# Patient Record
Sex: Female | Born: 1965 | Hispanic: Yes | Marital: Married | Smoking: Never smoker
Health system: Southern US, Community
[De-identification: ages and names within clinical notes are randomized; demographics above are authoritative.]

## PROBLEM LIST (undated history)

## (undated) DIAGNOSIS — I959 Hypotension, unspecified: Secondary | ICD-10-CM

## (undated) DIAGNOSIS — U071 COVID-19: Secondary | ICD-10-CM

## (undated) DIAGNOSIS — J690 Pneumonitis due to inhalation of food and vomit: Secondary | ICD-10-CM

## (undated) DIAGNOSIS — E119 Type 2 diabetes mellitus without complications: Secondary | ICD-10-CM

## (undated) DIAGNOSIS — J029 Acute pharyngitis, unspecified: Secondary | ICD-10-CM

## (undated) HISTORY — DX: Hypotension, unspecified: I95.9

## (undated) HISTORY — DX: Pneumonitis due to inhalation of food and vomit: J69.0

## (undated) HISTORY — DX: Acute pharyngitis, unspecified: J02.9

---

## 2006-03-23 ENCOUNTER — Emergency Department: Payer: Self-pay | Admitting: Emergency Medicine

## 2006-04-04 ENCOUNTER — Ambulatory Visit: Payer: Self-pay | Admitting: Advanced Practice Midwife

## 2007-04-18 ENCOUNTER — Ambulatory Visit: Payer: Self-pay | Admitting: Family Medicine

## 2007-05-20 ENCOUNTER — Encounter: Payer: Self-pay | Admitting: Maternal & Fetal Medicine

## 2007-06-24 ENCOUNTER — Encounter: Payer: Self-pay | Admitting: Maternal & Fetal Medicine

## 2019-02-26 ENCOUNTER — Other Ambulatory Visit: Payer: Self-pay

## 2019-02-26 DIAGNOSIS — Z20822 Contact with and (suspected) exposure to covid-19: Secondary | ICD-10-CM

## 2019-02-27 LAB — NOVEL CORONAVIRUS, NAA: SARS-CoV-2, NAA: NOT DETECTED

## 2019-03-10 ENCOUNTER — Other Ambulatory Visit: Payer: Self-pay

## 2019-03-10 ENCOUNTER — Emergency Department: Payer: HRSA Program

## 2019-03-10 ENCOUNTER — Emergency Department
Admission: EM | Admit: 2019-03-10 | Discharge: 2019-03-10 | Disposition: A | Discharge status: Home / Self Care | Payer: HRSA Program | Attending: Emergency Medicine | Admitting: Emergency Medicine

## 2019-03-10 DIAGNOSIS — R05 Cough: Secondary | ICD-10-CM

## 2019-03-10 DIAGNOSIS — U071 COVID-19: Secondary | ICD-10-CM | POA: Diagnosis not present

## 2019-03-10 DIAGNOSIS — R059 Cough, unspecified: Secondary | ICD-10-CM

## 2019-03-10 DIAGNOSIS — R0602 Shortness of breath: Secondary | ICD-10-CM

## 2019-03-10 DIAGNOSIS — E119 Type 2 diabetes mellitus without complications: Secondary | ICD-10-CM | POA: Insufficient documentation

## 2019-03-10 LAB — COMPREHENSIVE METABOLIC PANEL
ALT: 78 U/L — ABNORMAL HIGH (ref 0–44)
AST: 77 U/L — ABNORMAL HIGH (ref 15–41)
Albumin: 3.8 g/dL (ref 3.5–5.0)
Alkaline Phosphatase: 199 U/L — ABNORMAL HIGH (ref 38–126)
Anion gap: 10 (ref 5–15)
BUN: 16 mg/dL (ref 6–20)
CO2: 23 mmol/L (ref 22–32)
Calcium: 9.6 mg/dL (ref 8.9–10.3)
Chloride: 101 mmol/L (ref 98–111)
Creatinine, Ser: 0.55 mg/dL (ref 0.44–1.00)
GFR calc Af Amer: >60 mL/min (ref >60)
GFR calc non Af Amer: >60 mL/min (ref >60)
Glucose, Bld: 345 mg/dL — ABNORMAL HIGH (ref 70–99)
Potassium: 3.6 mmol/L (ref 3.5–5.1)
Sodium: 134 mmol/L — ABNORMAL LOW (ref 135–145)
Total Bilirubin: 0.6 mg/dL (ref 0.3–1.2)
Total Protein: 8.7 g/dL — ABNORMAL HIGH (ref 6.5–8.1)

## 2019-03-10 LAB — SARS CORONAVIRUS 2 BY RT PCR (HOSPITAL ORDER, PERFORMED IN ~~LOC~~ HOSPITAL LAB): SARS Coronavirus 2: POSITIVE — AB

## 2019-03-10 LAB — TROPONIN I (HIGH SENSITIVITY): Troponin I (High Sensitivity): 5 ng/L (ref <18)

## 2019-03-10 LAB — CBC
HCT: 44.5 % (ref 36.0–46.0)
Hemoglobin: 16.1 g/dL — ABNORMAL HIGH (ref 12.0–15.0)
MCH: 31.3 pg (ref 26.0–34.0)
MCHC: 36.2 g/dL — ABNORMAL HIGH (ref 30.0–36.0)
MCV: 86.4 fL (ref 80.0–100.0)
Platelets: 144 10*3/uL — ABNORMAL LOW (ref 150–400)
RBC: 5.15 MIL/uL — ABNORMAL HIGH (ref 3.87–5.11)
RDW: 11.9 % (ref 11.5–15.5)
WBC: 5.6 10*3/uL (ref 4.0–10.5)
nRBC: 0 % (ref 0.0–0.2)

## 2019-03-10 LAB — BRAIN NATRIURETIC PEPTIDE: B Natriuretic Peptide: 53 pg/mL (ref 0.0–100.0)

## 2019-03-10 MED ORDER — ACETAMINOPHEN 500 MG PO TABS
1000.0000 mg | ORAL_TABLET | Freq: Once | ORAL | Status: AC
  Administered 2019-03-10: 1000 mg via ORAL
  Filled 2019-03-10: qty 2

## 2019-03-10 MED ORDER — IPRATROPIUM-ALBUTEROL 0.5-2.5 (3) MG/3ML IN SOLN
3.0000 mL | RESPIRATORY_TRACT | Status: DC | PRN
  Administered 2019-03-10: 16:00:00 3 mL via RESPIRATORY_TRACT
  Filled 2019-03-10: qty 3

## 2019-03-10 MED ORDER — LACTATED RINGERS IV BOLUS
1000.0000 mL | Freq: Once | INTRAVENOUS | Status: AC
  Administered 2019-03-10: 16:00:00 1000 mL via INTRAVENOUS

## 2019-03-10 MED ORDER — ALBUTEROL SULFATE HFA 108 (90 BASE) MCG/ACT IN AERS: 2.0000 | INHALATION_SPRAY | Freq: Four times a day (QID) | RESPIRATORY_TRACT | 0 refills | Status: DC | PRN

## 2019-03-10 NOTE — ED Triage Notes (Signed)
Pt arrives via [facility] from home for SOB since yesterday, dx with covid on 08/19 and symptoms have been worsening. RR 27 at this time, O2 97% on RA. A&Ox4

## 2019-03-10 NOTE — ED Notes (Signed)
Pt reports chest pain 7/10 and headache 9/10, Dr. Charna Archer informed, verbal given for tylenol 1g PO

## 2019-03-10 NOTE — ED Provider Notes (Signed)
[facility] Emergency Department Provider Note   ____________________________________________   First MD Initiated Contact with Patient 03/10/19 1511     (approximate)  I have reviewed the triage vital signs and the nursing notes.   HISTORY  Chief Complaint Shortness of Breath    HPI Vickie Smith is a 53 y.o. female with possible history of type 2 diabetes who presents to the ED complaining of cough and shortness of breath.  Patient reports that she has been feeling malaised for at least the past week, recently tested positive for COVID-19 6 days ago.  Since then, she has had worsening nonproductive cough as well as difficulty catching her breath and soreness in her chest.  She has not noticed any pain or swelling in her legs, has not had any recent surgeries or chemotherapy.  She reports intermittent fevers as high as 101 at home.  She is not sure how she might of gotten COVID-19.        History reviewed. No pertinent past medical history.  There are no active problems to display for this patient.   History reviewed. No pertinent surgical history.  Prior to Admission medications   Medication Sig Start Date End Date Taking? Authorizing Provider  albuterol (VENTOLIN HFA) 108 (90 Base) MCG/ACT inhaler Inhale 2 puffs into the lungs every 6 (six) hours as needed for wheezing or shortness of breath. 03/10/19   Blake Divine, MD    Allergies Patient has no allergy information on record.  History reviewed. No pertinent family history.  Social History Social History   Tobacco Use  . Smoking status: Not on file  Substance Use Topics  . Alcohol use: Not on file  . Drug use: Not on file    Review of Systems  Constitutional: Positive for fevers and chills. Eyes: No visual changes. ENT: No sore throat. Cardiovascular: Positive for chest pain. Respiratory: Positive for shortness of breath and cough. Gastrointestinal: No abdominal pain.  No  nausea, no vomiting.  No diarrhea.  No constipation. Genitourinary: Negative for dysuria. Musculoskeletal: Negative for back pain. Skin: Negative for rash. Neurological: Negative for headaches, focal weakness or numbness.  ____________________________________________   PHYSICAL EXAM:  VITAL SIGNS: ED Triage Vitals  Enc Vitals Group     BP 03/10/19 1418 123/81     Pulse Rate 03/10/19 1418 68     Resp 03/10/19 1418 (!) 32     Temp 03/10/19 1418 99.1 F (37.3 C)     Temp Source 03/10/19 1418 Oral     SpO2 03/10/19 1417 97 %     Weight 03/10/19 1419 110 lb (49.9 kg)     Height 03/10/19 1419 5' 2"  (1.575 m)     Head Circumference --      Peak Flow --      Pain Score --      Pain Loc --      Pain Edu? --      Excl. in [place]? --     Constitutional: Alert and oriented. Eyes: Conjunctivae are normal. Head: Atraumatic. Nose: No congestion/rhinnorhea. Mouth/Throat: Mucous membranes are moist. Neck: Normal ROM Cardiovascular: Normal rate, regular rhythm. Grossly normal heart sounds. Respiratory: Tachypneic with no respiratory distress.  No retractions. Lungs CTAB. Gastrointestinal: Soft and nontender. No distention. Genitourinary: deferred Musculoskeletal: No lower extremity tenderness nor edema. Neurologic:  Normal speech and language. No gross focal neurologic deficits are appreciated. Skin:  Skin is warm, dry and intact. No rash noted. Psychiatric: Mood and affect are  normal. Speech and behavior are normal.  ____________________________________________   LABS (all labs ordered are listed, but only abnormal results are displayed)  Labs Reviewed  SARS CORONAVIRUS 2 ([place] [facility]) - Abnormal; Notable for the following components:      Result Value   SARS Coronavirus 2 POSITIVE (*)    All other components within normal limits  CBC - Abnormal; Notable for the following components:   RBC 5.15 (*)    Hemoglobin 16.1 (*)    MCHC 36.2 (*)     Platelets 144 (*)    All other components within normal limits  COMPREHENSIVE METABOLIC PANEL - Abnormal; Notable for the following components:   Sodium 134 (*)    Glucose, Bld 345 (*)    Total Protein 8.7 (*)    AST 77 (*)    ALT 78 (*)    Alkaline Phosphatase 199 (*)    All other components within normal limits  BRAIN NATRIURETIC PEPTIDE  TROPONIN I (HIGH SENSITIVITY)   ____________________________________________  EKG  ED ECG REPORT I, Blake Divine, the attending physician, personally viewed and interpreted this ECG.   Date: 03/10/2019  EKG Time: 14:23  Rate: 67  Rhythm: normal sinus rhythm  Axis: RAD  Intervals:none  ST&T Change: Normal    PROCEDURES  Procedure(s) performed (including Critical Care):  Procedures   ____________________________________________   INITIAL IMPRESSION / ASSESSMENT AND PLAN / ED COURSE       53 year old female with history of type 2 diabetes presents to the ED with worsening cough and shortness of breath in the setting of recent COVID-19 positive.  She is tachypneic upon arrival but in no significant respiratory distress and maintaining O2 sats.  Chest x-ray negative for acute process.  EKG without acute ischemic changes and troponin negative.  Do not suspect PE as she is low risk by [place], shortness of breath and chest soreness likely secondary to COVID-19.  Patient was ambulated without any decrease in O2 saturation, tachypnea improved following DuoNeb.  Will prescribe albuterol for home use, counseled patient on strict return precautions, patient agrees with plan.      ____________________________________________   FINAL CLINICAL IMPRESSION(S) / ED DIAGNOSES  Final diagnoses:  COVID-19 virus infection  Shortness of breath  Cough     ED Discharge Orders         Ordered    albuterol (VENTOLIN HFA) 108 (90 Base) MCG/ACT inhaler  Every 6 hours PRN     03/10/19 1803           Note:  This document was prepared  using Dragon voice recognition software and may include unintentional dictation errors.   Blake Divine, MD 03/10/19 [phone]

## 2019-03-10 NOTE — ED Notes (Signed)
Patient's triage completed via [facility] # [id number]

## 2019-07-29 ENCOUNTER — Encounter: Payer: Self-pay | Admitting: Emergency Medicine

## 2019-07-29 ENCOUNTER — Other Ambulatory Visit: Payer: Self-pay

## 2019-07-29 ENCOUNTER — Emergency Department
Admission: EM | Admit: 2019-07-29 | Discharge: 2019-07-29 | Disposition: A | Discharge status: Home / Self Care | Payer: Self-pay | Attending: Student | Admitting: Student

## 2019-07-29 DIAGNOSIS — R519 Headache, unspecified: Secondary | ICD-10-CM | POA: Insufficient documentation

## 2019-07-29 DIAGNOSIS — E1165 Type 2 diabetes mellitus with hyperglycemia: Secondary | ICD-10-CM | POA: Insufficient documentation

## 2019-07-29 DIAGNOSIS — Z7984 Long term (current) use of oral hypoglycemic drugs: Secondary | ICD-10-CM | POA: Insufficient documentation

## 2019-07-29 DIAGNOSIS — R739 Hyperglycemia, unspecified: Secondary | ICD-10-CM

## 2019-07-29 HISTORY — DX: Type 2 diabetes mellitus without complications: E11.9

## 2019-07-29 LAB — GLUCOSE, CAPILLARY: Glucose-Capillary: 432 mg/dL — ABNORMAL HIGH (ref 70–99)

## 2019-07-29 LAB — CBC
HCT: 42.8 % (ref 36.0–46.0)
Hemoglobin: 15.3 g/dL — ABNORMAL HIGH (ref 12.0–15.0)
MCH: 31.8 pg (ref 26.0–34.0)
MCHC: 35.7 g/dL (ref 30.0–36.0)
MCV: 89 fL (ref 80.0–100.0)
Platelets: 123 10*3/uL — ABNORMAL LOW (ref 150–400)
RBC: 4.81 MIL/uL (ref 3.87–5.11)
RDW: 12.3 % (ref 11.5–15.5)
WBC: 5.7 10*3/uL (ref 4.0–10.5)
nRBC: 0 % (ref 0.0–0.2)

## 2019-07-29 LAB — BASIC METABOLIC PANEL
Anion gap: 11 (ref 5–15)
BUN: 15 mg/dL (ref 6–20)
CO2: 25 mmol/L (ref 22–32)
Calcium: 9.6 mg/dL (ref 8.9–10.3)
Chloride: 98 mmol/L (ref 98–111)
Creatinine, Ser: 0.49 mg/dL (ref 0.44–1.00)
GFR calc Af Amer: >60 mL/min (ref >60)
GFR calc non Af Amer: >60 mL/min (ref >60)
Glucose, Bld: 414 mg/dL — ABNORMAL HIGH (ref 70–99)
Potassium: 4.2 mmol/L (ref 3.5–5.1)
Sodium: 134 mmol/L — ABNORMAL LOW (ref 135–145)

## 2019-07-29 LAB — URINALYSIS, COMPLETE (UACMP) WITH MICROSCOPIC
Bacteria, UA: NONE SEEN
Bilirubin Urine: NEGATIVE
Glucose, UA: >=500 mg/dL — AB
Hgb urine dipstick: NEGATIVE
Ketones, ur: NEGATIVE mg/dL
Leukocytes,Ua: NEGATIVE
Nitrite: NEGATIVE
Protein, ur: NEGATIVE mg/dL
Specific Gravity, Urine: 1.033 — ABNORMAL HIGH (ref 1.005–1.030)
Squamous Epithelial / HPF: NONE SEEN (ref 0–5)
WBC, UA: NONE SEEN WBC/hpf (ref 0–5)
pH: 7 (ref 5.0–8.0)

## 2019-07-29 MED ORDER — BUTALBITAL-APAP-CAFFEINE 50-325-40 MG PO TABS
1.0000 | ORAL_TABLET | Freq: Once | ORAL | Status: AC
  Administered 2019-07-29: 1 via ORAL
  Filled 2019-07-29: qty 1

## 2019-07-29 MED ORDER — KETOROLAC TROMETHAMINE 30 MG/ML IJ SOLN
15.0000 mg | Freq: Once | INTRAMUSCULAR | Status: AC
  Administered 2019-07-29: 15 mg via INTRAVENOUS
  Filled 2019-07-29: qty 1

## 2019-07-29 MED ORDER — METOCLOPRAMIDE HCL 5 MG/ML IJ SOLN: 10.0000 mg | Freq: Once | INTRAMUSCULAR | Status: DC

## 2019-07-29 MED ORDER — METOCLOPRAMIDE HCL 5 MG/ML IJ SOLN
5.0000 mg | Freq: Once | INTRAMUSCULAR | Status: AC
  Administered 2019-07-29: 14:00:00 5 mg via INTRAVENOUS
  Filled 2019-07-29: qty 2

## 2019-07-29 MED ORDER — DIPHENHYDRAMINE HCL 50 MG/ML IJ SOLN
12.5000 mg | Freq: Once | INTRAMUSCULAR | Status: AC
  Administered 2019-07-29: 14:00:00 12.5 mg via INTRAVENOUS
  Filled 2019-07-29: qty 1

## 2019-07-29 MED ORDER — ACETAMINOPHEN 500 MG PO TABS
1000.0000 mg | ORAL_TABLET | Freq: Once | ORAL | Status: AC
  Administered 2019-07-29: 14:00:00 1000 mg via ORAL
  Filled 2019-07-29: qty 2

## 2019-07-29 MED ORDER — SODIUM CHLORIDE 0.9 % IV BOLUS
1000.0000 mL | Freq: Once | INTRAVENOUS | Status: AC
  Administered 2019-07-29: 1000 mL via INTRAVENOUS

## 2019-07-29 NOTE — ED Triage Notes (Addendum)
Pt here for headache to right side of head. + blurry vision to both eyes.  + nausea.  Denies vomiting/diarrhea. No fever. No history of headaches or ever having a headache like this.  Has trained OTC meds for pain with no relief.  Has not been taking her metformin because it makes her vomit.  No photophobia.

## 2019-07-29 NOTE — ED Notes (Signed)
Pt c/o worsening HA over the past 2 weeks on right side of neck/shoulder and head with blurred vision. Pt is suppose to be on metformin but states it causes N/V and does not take it. Pt [id number]. Pt has pain with pushing and pulling upper extremities on the right neck/shoulder and grimaces.  No noted neuro deficits. At this time. Pt is ambulatory from the [facility] [place] to exam room 50 with a steady gait.

## 2019-07-29 NOTE — Discharge Instructions (Addendum)
Thank you for letting us take care of you in the emergency department today.   Please continue to take any regular, prescribed medications. Please stay well hydrated and continue to eat a healthy, well balanced diet.   Please follow up with: - Your primary care doctor to review your ER visit and follow up on your symptoms.   Please return to the ER for any new or worsening symptoms.    Gracias por dejarnos atenderlo en el departamento de emergencias hoy.  Contine tomando cualquier medicamento recetado con regularidad. Mantngase bien hidratado y contine con Ardelia Mems dieta sana y equilibrada.  Contine con: - Su mdico de atencin primaria para revisar su visita a la sala de emergencias y hacer un seguimiento de sus sntomas.  Regrese a la sala de emergencias si tiene sntomas nuevos o que empeoran.

## 2019-07-29 NOTE — ED Notes (Signed)
[place] not working. Pt verbalized understands discharge instructions per interpreter.

## 2019-07-29 NOTE — ED Provider Notes (Signed)
[facility] Emergency Department Provider Note  ____________________________________________   First MD Initiated Contact with Patient 07/29/19 1338     (approximate)  I have reviewed the triage vital signs and the nursing notes.  History  Chief Complaint Headache    HPI Vickie Smith is a 54 y.o. female with history of diabetes who presents to the emergency department for headache.  Patient states she has had an ongoing headache for the last 2 weeks.  Severity has been increasing over time, currently 10/10 in severity.  Not thunderclap or worst at onset.  Headache is located on the right side of her head as well as the right lateral neck and shoulder area.  She describes it as a throbbing type sensation.  Associated with some nausea, but no vomiting.  No weakness, numbness, tingling, speech difficulties, or facial droop.  She reports some bilateral blurry vision, but this has been ongoing for quite some time and seems to be intermittent, worsens when she is not feeling well.  She has a history of diabetes, prescribed Metformin, but has not been taking it due to side effects including nausea and vomiting.   Past Medical Hx Past Medical History:  Diagnosis Date  . Diabetes mellitus without complication ([place])     Problem List There are no problems to display for this patient.   Past Surgical Hx History reviewed. No pertinent surgical history.  Medications Prior to Admission medications   Medication Sig Start Date End Date Taking? Authorizing Provider  albuterol (VENTOLIN HFA) 108 (90 Base) MCG/ACT inhaler Inhale 2 puffs into the lungs every 6 (six) hours as needed for wheezing or shortness of breath. 03/10/19   Blake Divine, MD    Allergies Patient has no known allergies.  Family Hx History reviewed. No pertinent family history.  Social Hx Social History   Tobacco Use  . Smoking status: Never Smoker  . Smokeless tobacco: Never Used    Substance Use Topics  . Alcohol use: Never  . Drug use: Never     Review of Systems  Constitutional: Negative for fever, chills. Eyes: + for visual changes. ENT: Negative for sore throat. Cardiovascular: Negative for chest pain. Respiratory: Negative for shortness of breath. Gastrointestinal: Negative for nausea, vomiting.  Genitourinary: Negative for dysuria. Musculoskeletal: Negative for leg swelling. Skin: Negative for rash. Neurological: +  for headaches.   Physical Exam  Vital Signs: ED Triage Vitals [07/29/19 1132]  Enc Vitals Group     BP 110/83     Pulse Rate 79     Resp 18     Temp 98.4 F (36.9 C)     Temp Source Oral     SpO2 97 %     Weight 119 lb (54 kg)     Height 4' 9"  (1.448 m)     Head Circumference      Peak Flow      Pain Score 10     Pain Loc      Pain Edu?      Excl. in [place]?     Constitutional: Alert and oriented.  Head: Normocephalic. Atraumatic.  No tenderness to palpation along the temporal artery.  No temporal artery beading appreciated on palpation. Eyes: Conjunctivae clear. Sclera anicteric. Nose: No congestion. No rhinorrhea. Mouth/Throat: Wearing mask.  Neck: No stridor.   Cardiovascular: Normal rate, regular rhythm. Extremities well perfused. Respiratory: Normal respiratory effort.  Lungs CTAB. Gastrointestinal: Soft. Non-tender. Non-distended.  Musculoskeletal: No lower extremity edema. No deformities. Neurologic:  Normal speech and language. No gross focal neurologic deficits are appreciated. Alert and oriented.  Face symmetric.  Tongue midline.  Cranial nerves II through XII intact. UE and LE strength 5/5 and symmetric. UE and LE SILT. Ambulatory w/ steady gait.  Skin: Skin is warm, dry and intact. No rash noted. Psychiatric: Mood and affect are appropriate for situation.    Procedures  Procedure(s) performed (including critical care):  Procedures   Initial Impression / Assessment and Plan / ED Course  54 y.o. female  who presents to the ED for headache, as above.  Ddx: tension headache, migraine headache, MSK.  No fevers, neck stiffness, meningismus, doubt meningitis.  No tenderness along the temporal artery, or temporal artery beading, doubt temporal arteritis.  Not thunderclap in description or worst at onset, doubt SAH. Suspect some component of her feeling generally unwell, as well as her blurry vision, may be related to her diabetes and uncontrolled hyperglycemia.  Emphasized the importance of managing this through her PCP. Advised following up with the PCP within at least 1 week to rediscuss her medication regimen and control of her blood sugar.  Basic labs reveal hyperglycemia, no anion gap or evidence of DKA.  Plan for symptomatic treatment and reassess.  Patient reports improvement in her headache from 10/10 originally down to 6/10.  Feel comfortable with discharge at this time.  Again emphasized the importance of outpatient PCP follow-up.  She voices understanding of this.  Given return precautions.   Final Clinical Impression(s) / ED Diagnosis  Final diagnoses:  Pounding headache  Hyperglycemia       Note:  This document was prepared using Dragon voice recognition software and may include unintentional dictation errors.   Lilia Pro., MD 07/29/19 [phone]

## 2019-08-12 ENCOUNTER — Encounter
Admission: EM | Disposition: A | Discharge status: Other Acute Inpatient Hospital | Payer: Self-pay | Source: Home / Self Care | Attending: Internal Medicine

## 2019-08-12 ENCOUNTER — Other Ambulatory Visit: Payer: Self-pay

## 2019-08-12 ENCOUNTER — Observation Stay: Payer: Self-pay | Admitting: Certified Registered Nurse Anesthetist

## 2019-08-12 ENCOUNTER — Inpatient Hospital Stay
Admission: EM | Admit: 2019-08-12 | Discharge: 2019-08-17 | DRG: 166 | Disposition: A | Discharge status: Other Acute Inpatient Hospital | Payer: Self-pay | Attending: Internal Medicine | Admitting: Internal Medicine

## 2019-08-12 ENCOUNTER — Emergency Department: Payer: Self-pay

## 2019-08-12 ENCOUNTER — Inpatient Hospital Stay: Payer: Self-pay

## 2019-08-12 DIAGNOSIS — D6959 Other secondary thrombocytopenia: Secondary | ICD-10-CM | POA: Diagnosis present

## 2019-08-12 DIAGNOSIS — J690 Pneumonitis due to inhalation of food and vomit: Secondary | ICD-10-CM

## 2019-08-12 DIAGNOSIS — R042 Hemoptysis: Secondary | ICD-10-CM

## 2019-08-12 DIAGNOSIS — K3189 Other diseases of stomach and duodenum: Secondary | ICD-10-CM | POA: Diagnosis present

## 2019-08-12 DIAGNOSIS — D696 Thrombocytopenia, unspecified: Secondary | ICD-10-CM

## 2019-08-12 DIAGNOSIS — J9690 Respiratory failure, unspecified, unspecified whether with hypoxia or hypercapnia: Secondary | ICD-10-CM | POA: Diagnosis present

## 2019-08-12 DIAGNOSIS — J9601 Acute respiratory failure with hypoxia: Secondary | ICD-10-CM | POA: Diagnosis present

## 2019-08-12 DIAGNOSIS — K7581 Nonalcoholic steatohepatitis (NASH): Secondary | ICD-10-CM | POA: Diagnosis present

## 2019-08-12 DIAGNOSIS — E1165 Type 2 diabetes mellitus with hyperglycemia: Secondary | ICD-10-CM

## 2019-08-12 DIAGNOSIS — K766 Portal hypertension: Secondary | ICD-10-CM | POA: Diagnosis present

## 2019-08-12 DIAGNOSIS — Z8616 Personal history of COVID-19: Secondary | ICD-10-CM

## 2019-08-12 DIAGNOSIS — K922 Gastrointestinal hemorrhage, unspecified: Secondary | ICD-10-CM

## 2019-08-12 DIAGNOSIS — J9691 Respiratory failure, unspecified with hypoxia: Secondary | ICD-10-CM

## 2019-08-12 DIAGNOSIS — K7460 Unspecified cirrhosis of liver: Secondary | ICD-10-CM

## 2019-08-12 DIAGNOSIS — R7989 Other specified abnormal findings of blood chemistry: Secondary | ICD-10-CM | POA: Diagnosis present

## 2019-08-12 DIAGNOSIS — R0489 Hemorrhage from other sites in respiratory passages: Principal | ICD-10-CM | POA: Diagnosis present

## 2019-08-12 DIAGNOSIS — J9602 Acute respiratory failure with hypercapnia: Secondary | ICD-10-CM | POA: Diagnosis present

## 2019-08-12 DIAGNOSIS — J029 Acute pharyngitis, unspecified: Secondary | ICD-10-CM

## 2019-08-12 DIAGNOSIS — R945 Abnormal results of liver function studies: Secondary | ICD-10-CM | POA: Diagnosis present

## 2019-08-12 DIAGNOSIS — R739 Hyperglycemia, unspecified: Secondary | ICD-10-CM

## 2019-08-12 DIAGNOSIS — E119 Type 2 diabetes mellitus without complications: Secondary | ICD-10-CM

## 2019-08-12 DIAGNOSIS — I959 Hypotension, unspecified: Secondary | ICD-10-CM

## 2019-08-12 DIAGNOSIS — K920 Hematemesis: Secondary | ICD-10-CM

## 2019-08-12 HISTORY — DX: Hematemesis: K92.0

## 2019-08-12 HISTORY — DX: Hemoptysis: R04.2

## 2019-08-12 HISTORY — DX: Gastrointestinal hemorrhage, unspecified: K92.2

## 2019-08-12 HISTORY — PX: ESOPHAGOGASTRODUODENOSCOPY: SHX5428

## 2019-08-12 LAB — CBC WITH DIFFERENTIAL/PLATELET
Abs Immature Granulocytes: 0.01 10*3/uL (ref 0.00–0.07)
Basophils Absolute: 0.1 10*3/uL (ref 0.0–0.1)
Basophils Relative: 2 %
Eosinophils Absolute: 0.2 10*3/uL (ref 0.0–0.5)
Eosinophils Relative: 4 %
HCT: 42.9 % (ref 36.0–46.0)
Hemoglobin: 15.3 g/dL — ABNORMAL HIGH (ref 12.0–15.0)
Immature Granulocytes: 0 %
Lymphocytes Relative: 41 %
Lymphs Abs: 1.9 10*3/uL (ref 0.7–4.0)
MCH: 31.4 pg (ref 26.0–34.0)
MCHC: 35.7 g/dL (ref 30.0–36.0)
MCV: 87.9 fL (ref 80.0–100.0)
Monocytes Absolute: 0.3 10*3/uL (ref 0.1–1.0)
Monocytes Relative: 6 %
Neutro Abs: 2.2 10*3/uL (ref 1.7–7.7)
Neutrophils Relative %: 47 %
Platelets: 119 10*3/uL — ABNORMAL LOW (ref 150–400)
RBC: 4.88 MIL/uL (ref 3.87–5.11)
RDW: 12.2 % (ref 11.5–15.5)
WBC: 4.7 10*3/uL (ref 4.0–10.5)
nRBC: 0 % (ref 0.0–0.2)

## 2019-08-12 LAB — GLUCOSE, CAPILLARY
Glucose-Capillary: 189 mg/dL — ABNORMAL HIGH (ref 70–99)
Glucose-Capillary: 155 mg/dL — ABNORMAL HIGH (ref 70–99)
Glucose-Capillary: 242 mg/dL — ABNORMAL HIGH (ref 70–99)

## 2019-08-12 LAB — URINE DRUG SCREEN, QUALITATIVE (ARMC ONLY)
Amphetamines, Ur Screen: NOT DETECTED
Barbiturates, Ur Screen: NOT DETECTED
Benzodiazepine, Ur Scrn: NOT DETECTED
Cannabinoid 50 Ng, Ur ~~LOC~~: NOT DETECTED
Cocaine Metabolite,Ur ~~LOC~~: NOT DETECTED
MDMA (Ecstasy)Ur Screen: NOT DETECTED
Methadone Scn, Ur: NOT DETECTED
Opiate, Ur Screen: NOT DETECTED
Phencyclidine (PCP) Ur S: NOT DETECTED
Tricyclic, Ur Screen: NOT DETECTED

## 2019-08-12 LAB — COMPREHENSIVE METABOLIC PANEL
ALT: 109 U/L — ABNORMAL HIGH (ref 0–44)
AST: 79 U/L — ABNORMAL HIGH (ref 15–41)
Albumin: 4.3 g/dL (ref 3.5–5.0)
Alkaline Phosphatase: 174 U/L — ABNORMAL HIGH (ref 38–126)
Anion gap: 12 (ref 5–15)
BUN: 16 mg/dL (ref 6–20)
CO2: 20 mmol/L — ABNORMAL LOW (ref 22–32)
Calcium: 9.5 mg/dL (ref 8.9–10.3)
Chloride: 103 mmol/L (ref 98–111)
Creatinine, Ser: 0.48 mg/dL (ref 0.44–1.00)
GFR calc Af Amer: >60 mL/min (ref >60)
GFR calc non Af Amer: >60 mL/min (ref >60)
Glucose, Bld: 317 mg/dL — ABNORMAL HIGH (ref 70–99)
Potassium: 3.9 mmol/L (ref 3.5–5.1)
Sodium: 135 mmol/L (ref 135–145)
Total Bilirubin: 0.7 mg/dL (ref 0.3–1.2)
Total Protein: 8.4 g/dL — ABNORMAL HIGH (ref 6.5–8.1)

## 2019-08-12 LAB — CBC
HCT: 43.1 % (ref 36.0–46.0)
HCT: 37.1 % (ref 36.0–46.0)
Hemoglobin: 15.4 g/dL — ABNORMAL HIGH (ref 12.0–15.0)
Hemoglobin: 13 g/dL (ref 12.0–15.0)
MCH: 31.2 pg (ref 26.0–34.0)
MCH: 31.6 pg (ref 26.0–34.0)
MCHC: 35.7 g/dL (ref 30.0–36.0)
MCHC: 35 g/dL (ref 30.0–36.0)
MCV: 87.2 fL (ref 80.0–100.0)
MCV: 90 fL (ref 80.0–100.0)
Platelets: 130 10*3/uL — ABNORMAL LOW (ref 150–400)
Platelets: 111 10*3/uL — ABNORMAL LOW (ref 150–400)
RBC: 4.94 MIL/uL (ref 3.87–5.11)
RBC: 4.12 MIL/uL (ref 3.87–5.11)
RDW: 12.3 % (ref 11.5–15.5)
RDW: 12.6 % (ref 11.5–15.5)
WBC: 5.7 10*3/uL (ref 4.0–10.5)
WBC: 5.8 10*3/uL (ref 4.0–10.5)
nRBC: 0 % (ref 0.0–0.2)
nRBC: 0 % (ref 0.0–0.2)

## 2019-08-12 LAB — URINALYSIS, COMPLETE (UACMP) WITH MICROSCOPIC
Bacteria, UA: NONE SEEN
Bilirubin Urine: NEGATIVE
Glucose, UA: >=500 mg/dL — AB
Hgb urine dipstick: NEGATIVE
Ketones, ur: NEGATIVE mg/dL
Leukocytes,Ua: NEGATIVE
Nitrite: NEGATIVE
Protein, ur: NEGATIVE mg/dL
Specific Gravity, Urine: 1.02 (ref 1.005–1.030)
pH: 5 (ref 5.0–8.0)

## 2019-08-12 LAB — HEMOGLOBIN A1C
Hgb A1c MFr Bld: 11.5 % — ABNORMAL HIGH (ref 4.8–5.6)
Mean Plasma Glucose: 283.35 mg/dL

## 2019-08-12 LAB — LIPASE, BLOOD: Lipase: 56 U/L — ABNORMAL HIGH (ref 11–51)

## 2019-08-12 LAB — PROTIME-INR
INR: 1 (ref 0.8–1.2)
Prothrombin Time: 13.3 seconds (ref 11.4–15.2)

## 2019-08-12 LAB — HEPATITIS PANEL, ACUTE
HCV Ab: NONREACTIVE
Hep A IgM: NONREACTIVE
Hep B C IgM: NONREACTIVE
Hepatitis B Surface Ag: NONREACTIVE

## 2019-08-12 LAB — ETHANOL: Alcohol, Ethyl (B): <10 mg/dL (ref <10)

## 2019-08-12 LAB — HIV ANTIBODY (ROUTINE TESTING W REFLEX): HIV Screen 4th Generation wRfx: NONREACTIVE

## 2019-08-12 LAB — RESPIRATORY PANEL BY RT PCR (FLU A&B, COVID)
Influenza A by PCR: NEGATIVE
Influenza B by PCR: NEGATIVE
SARS Coronavirus 2 by RT PCR: NEGATIVE

## 2019-08-12 LAB — APTT: aPTT: 33 seconds (ref 24–36)

## 2019-08-12 SURGERY — EGD (ESOPHAGOGASTRODUODENOSCOPY)
Anesthesia: General

## 2019-08-12 MED ORDER — ROCURONIUM BROMIDE 100 MG/10ML IV SOLN
INTRAVENOUS | Status: DC | PRN
  Administered 2019-08-12: 30 mg via INTRAVENOUS

## 2019-08-12 MED ORDER — IOHEXOL 350 MG/ML SOLN
60.0000 mL | Freq: Once | INTRAVENOUS | Status: AC | PRN
  Administered 2019-08-12: 60 mL via INTRAVENOUS

## 2019-08-12 MED ORDER — VECURONIUM BROMIDE 10 MG IV SOLR: 10.0000 mg | INTRAVENOUS | Status: DC | PRN

## 2019-08-12 MED ORDER — PROPOFOL 1000 MG/100ML IV EMUL
0.0000 ug/kg/min | INTRAVENOUS | Status: DC
  Administered 2019-08-12: 50 ug/kg/min via INTRAVENOUS
  Administered 2019-08-12: 75 ug/kg/min via INTRAVENOUS
  Administered 2019-08-13: 20 ug/kg/min via INTRAVENOUS
  Filled 2019-08-12: qty 100

## 2019-08-12 MED ORDER — SODIUM CHLORIDE 0.9 % IV SOLN
8.0000 mg/h | INTRAVENOUS | Status: DC
  Filled 2019-08-12: qty 80

## 2019-08-12 MED ORDER — METHYLPREDNISOLONE SODIUM SUCC 40 MG IJ SOLR
40.0000 mg | Freq: Two times a day (BID) | INTRAMUSCULAR | Status: DC
  Administered 2019-08-13 – 2019-08-15 (×5): 40 mg via INTRAVENOUS
  Filled 2019-08-12 (×5): qty 1

## 2019-08-12 MED ORDER — FENTANYL CITRATE (PF) 100 MCG/2ML IJ SOLN
INTRAMUSCULAR | Status: AC
  Filled 2019-08-12: qty 2

## 2019-08-12 MED ORDER — ONDANSETRON HCL 4 MG/2ML IJ SOLN
INTRAMUSCULAR | Status: AC
  Filled 2019-08-12: qty 2

## 2019-08-12 MED ORDER — SODIUM CHLORIDE 0.9 % IV SOLN
500.0000 mg | INTRAVENOUS | Status: DC
  Administered 2019-08-12 – 2019-08-16 (×4): 500 mg via INTRAVENOUS
  Filled 2019-08-12 (×5): qty 500

## 2019-08-12 MED ORDER — METHYLPREDNISOLONE SODIUM SUCC 125 MG IJ SOLR
125.0000 mg | Freq: Once | INTRAMUSCULAR | Status: AC
  Administered 2019-08-12: 20:00:00 125 mg via INTRAVENOUS
  Filled 2019-08-12: qty 2

## 2019-08-12 MED ORDER — SODIUM CHLORIDE 0.9 % IV SOLN
0.0000 ug/min | INTRAVENOUS | Status: DC
  Filled 2019-08-12: qty 1

## 2019-08-12 MED ORDER — SODIUM CHLORIDE 0.9 % IV SOLN: 250.0000 mL | INTRAVENOUS | Status: DC | PRN

## 2019-08-12 MED ORDER — PROPOFOL 10 MG/ML IV BOLUS
INTRAVENOUS | Status: DC | PRN
  Administered 2019-08-12: 80 mg via INTRAVENOUS
  Administered 2019-08-12: 100 mg via INTRAVENOUS
  Administered 2019-08-12: 20 mg via INTRAVENOUS

## 2019-08-12 MED ORDER — LIDOCAINE HCL (CARDIAC) PF 100 MG/5ML IV SOSY
PREFILLED_SYRINGE | INTRAVENOUS | Status: DC | PRN
  Administered 2019-08-12 (×2): 50 mg via INTRATRACHEAL

## 2019-08-12 MED ORDER — INSULIN ASPART 100 UNIT/ML ~~LOC~~ SOLN
0.0000 [IU] | Freq: Every day | SUBCUTANEOUS | Status: DC
  Administered 2019-08-12: 22:00:00 2 [IU] via SUBCUTANEOUS
  Filled 2019-08-12: qty 1

## 2019-08-12 MED ORDER — FENTANYL 2500MCG IN NS 250ML (10MCG/ML) PREMIX INFUSION
0.0000 ug/h | INTRAVENOUS | Status: DC
  Administered 2019-08-13: 125 ug/h via INTRAVENOUS
  Filled 2019-08-12: qty 250

## 2019-08-12 MED ORDER — LIDOCAINE VISCOUS HCL 2 % MT SOLN
15.0000 mL | Freq: Once | OROMUCOSAL | Status: AC
  Administered 2019-08-12: 11:00:00 15 mL via ORAL
  Filled 2019-08-12: qty 15

## 2019-08-12 MED ORDER — MIDAZOLAM HCL 2 MG/2ML IJ SOLN: 2.0000 mg | INTRAMUSCULAR | Status: DC | PRN

## 2019-08-12 MED ORDER — PROPOFOL 10 MG/ML IV BOLUS
INTRAVENOUS | Status: AC
  Filled 2019-08-12: qty 20

## 2019-08-12 MED ORDER — SODIUM CHLORIDE 0.9 % IV SOLN
2.0000 g | Freq: Two times a day (BID) | INTRAVENOUS | Status: DC
  Administered 2019-08-12: 23:00:00 2 g via INTRAVENOUS
  Filled 2019-08-12: qty 2
  Filled 2019-08-12: qty 20

## 2019-08-12 MED ORDER — SODIUM CHLORIDE 0.9% FLUSH: 3.0000 mL | INTRAVENOUS | Status: DC | PRN

## 2019-08-12 MED ORDER — ONDANSETRON HCL 4 MG/2ML IJ SOLN
4.0000 mg | Freq: Four times a day (QID) | INTRAMUSCULAR | Status: DC | PRN
  Administered 2019-08-14 – 2019-08-16 (×4): 4 mg via INTRAVENOUS
  Filled 2019-08-12 (×5): qty 2

## 2019-08-12 MED ORDER — ONDANSETRON HCL 4 MG/2ML IJ SOLN
4.0000 mg | Freq: Once | INTRAMUSCULAR | Status: AC
  Administered 2019-08-12: 08:00:00 4 mg via INTRAVENOUS
  Filled 2019-08-12: qty 2

## 2019-08-12 MED ORDER — PANTOPRAZOLE SODIUM 40 MG IV SOLR: 40.0000 mg | Freq: Two times a day (BID) | INTRAVENOUS | Status: DC

## 2019-08-12 MED ORDER — ACETAMINOPHEN 325 MG PO TABS: 650.0000 mg | ORAL_TABLET | ORAL | Status: DC | PRN

## 2019-08-12 MED ORDER — SENNOSIDES 8.8 MG/5ML PO SYRP
5.0000 mL | ORAL_SOLUTION | Freq: Two times a day (BID) | ORAL | Status: DC | PRN
  Filled 2019-08-12: qty 5

## 2019-08-12 MED ORDER — INSULIN ASPART 100 UNIT/ML ~~LOC~~ SOLN
0.0000 [IU] | Freq: Three times a day (TID) | SUBCUTANEOUS | Status: DC
  Administered 2019-08-13 (×2): 3 [IU] via SUBCUTANEOUS
  Filled 2019-08-12 (×2): qty 1

## 2019-08-12 MED ORDER — ONDANSETRON HCL 4 MG/2ML IJ SOLN: 4.0000 mg | Freq: Three times a day (TID) | INTRAMUSCULAR | Status: DC | PRN

## 2019-08-12 MED ORDER — FAMOTIDINE IN NACL 20-0.9 MG/50ML-% IV SOLN
20.0000 mg | Freq: Two times a day (BID) | INTRAVENOUS | Status: DC
  Administered 2019-08-12 – 2019-08-17 (×10): 20 mg via INTRAVENOUS
  Filled 2019-08-12 (×10): qty 50

## 2019-08-12 MED ORDER — ALUM & MAG HYDROXIDE-SIMETH 200-200-20 MG/5ML PO SUSP
30.0000 mL | Freq: Once | ORAL | Status: AC
  Administered 2019-08-12: 11:00:00 30 mL via ORAL
  Filled 2019-08-12: qty 30

## 2019-08-12 MED ORDER — MIDAZOLAM HCL 2 MG/2ML IJ SOLN
2.0000 mg | INTRAMUSCULAR | Status: DC | PRN
  Administered 2019-08-12 (×2): 2 mg via INTRAVENOUS
  Filled 2019-08-12 (×2): qty 2

## 2019-08-12 MED ORDER — SODIUM CHLORIDE 0.9% FLUSH
3.0000 mL | Freq: Two times a day (BID) | INTRAVENOUS | Status: DC
  Administered 2019-08-12 – 2019-08-15 (×5): 3 mL via INTRAVENOUS

## 2019-08-12 MED ORDER — PHENYLEPHRINE HCL-NACL 10-0.9 MG/250ML-% IV SOLN
0.0000 ug/min | INTRAVENOUS | Status: DC
  Filled 2019-08-12: qty 250

## 2019-08-12 MED ORDER — VECURONIUM BROMIDE 10 MG IV SOLR
INTRAVENOUS | Status: AC
  Administered 2019-08-12: 19:00:00 10 mg via INTRAVENOUS
  Filled 2019-08-12: qty 10

## 2019-08-12 MED ORDER — CHLORHEXIDINE GLUCONATE CLOTH 2 % EX PADS
6.0000 | MEDICATED_PAD | Freq: Every day | CUTANEOUS | Status: DC
  Administered 2019-08-12 – 2019-08-15 (×3): 6 via TOPICAL

## 2019-08-12 MED ORDER — MORPHINE SULFATE (PF) 2 MG/ML IV SOLN: 2.0000 mg | INTRAVENOUS | Status: DC | PRN

## 2019-08-12 MED ORDER — SODIUM CHLORIDE 0.9 % IV SOLN
50.0000 ug/h | INTRAVENOUS | Status: DC
  Filled 2019-08-12 (×3): qty 1

## 2019-08-12 MED ORDER — FENTANYL CITRATE (PF) 100 MCG/2ML IJ SOLN
INTRAMUSCULAR | Status: DC | PRN
  Administered 2019-08-12: 50 ug via INTRAVENOUS
  Administered 2019-08-12: 25 ug via INTRAVENOUS
  Administered 2019-08-12: 50 ug via INTRAVENOUS

## 2019-08-12 MED ORDER — LIDOCAINE HCL (PF) 2 % IJ SOLN
INTRAMUSCULAR | Status: AC
  Filled 2019-08-12: qty 10

## 2019-08-12 MED ORDER — LORAZEPAM 2 MG/ML IJ SOLN: 2.0000 mg | Freq: Once | INTRAMUSCULAR | Status: AC

## 2019-08-12 MED ORDER — SUCCINYLCHOLINE CHLORIDE 20 MG/ML IJ SOLN
INTRAMUSCULAR | Status: DC | PRN
  Administered 2019-08-12: 60 mg via INTRAVENOUS
  Administered 2019-08-12: 80 mg via INTRAVENOUS

## 2019-08-12 MED ORDER — SODIUM CHLORIDE 0.9 % IV SOLN: 1.0000 g | Freq: Once | INTRAVENOUS | Status: DC

## 2019-08-12 MED ORDER — PANTOPRAZOLE SODIUM 40 MG IV SOLR
40.0000 mg | Freq: Once | INTRAVENOUS | Status: AC
  Administered 2019-08-12: 08:00:00 40 mg via INTRAVENOUS
  Filled 2019-08-12: qty 40

## 2019-08-12 MED ORDER — LORAZEPAM 2 MG/ML IJ SOLN
INTRAMUSCULAR | Status: AC
  Administered 2019-08-12: 19:00:00 2 mg via INTRAVENOUS
  Filled 2019-08-12: qty 1

## 2019-08-12 MED ORDER — FENTANYL 2500MCG IN NS 250ML (10MCG/ML) PREMIX INFUSION
INTRAVENOUS | Status: AC
  Administered 2019-08-12: 19:00:00 2500 ug via INTRAVENOUS
  Filled 2019-08-12: qty 250

## 2019-08-12 MED ORDER — SODIUM CHLORIDE 0.9 % IV SOLN
INTRAVENOUS | Status: DC
  Administered 2019-08-12: 15:00:00 1000 mL via INTRAVENOUS

## 2019-08-12 MED ORDER — ONDANSETRON HCL 4 MG/2ML IJ SOLN
INTRAMUSCULAR | Status: DC | PRN
  Administered 2019-08-12: 4 mg via INTRAVENOUS

## 2019-08-12 NOTE — Progress Notes (Signed)
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  There is absolutely no blood in the stomach or esophagus.  No varices.  After patient is extubated in the endoscopy room, she started coughing up fresh blood, anesthesiologist bedside.  There was no blood in the oropharynx during withdrawal of the scope.  Recommend urgent pulmonary evaluation to evaluate for pulmonary hemorrhage Dr Blaine Hamper is made aware  Cephas Darby, MD [address]  [place]  [place], [place] [address]  Main: [phone]  Fax: [phone] Pager: [phone]

## 2019-08-12 NOTE — Care Management (Signed)
This is a no charge note  54 year old lady with past medical history of hypertension, who was initially admitted due to patient reported hematemesis.  GI was consulted, Dr. Marius Ditch did urgent EGD, which was negative. Patient was found to have severe hemoptysis at the procedure, suspecting pulmonary hemorrhage.  Patient was intubated in order to protect her airway.  Dr. Mortimer Fries of ICU was consulted, who took over the care and pt is admitted to ICU.  Ivor Costa, MD  [facility]   If 7PM-7AM, please contact night-coverage [email or web address] Password [facility] 08/12/2019, 7:28 PM

## 2019-08-12 NOTE — Consult Note (Signed)
Cephas Darby, MD [address]  [place]  [place],  [address]  Main: [phone]  Fax: [phone] Pager: [phone]   Consultation  Referring Provider:     No ref. provider found Primary Care Physician:  [facility] Primary Gastroenterologist: Althia Forts        Reason for Consultation:     Hematemesis  Date of Admission:  08/12/2019 Date of Consultation:  08/12/2019         HPI:   Vickie Smith is a 54 y.o. female Spanish-speaking, history of poorly controlled diabetes, not on insulin, hemoglobin A1c 11.2 who is admitted with 3 episodes of hematemesis, epigastric pain that started last night.  She also reported 1 episode of melena today.  She denies alcohol use, tobacco use.  Her hemoglobin is 15.3, platelets 119.  She does have chronic thrombocytopenia of unknown etiology since 02/2019.  Her LFTs returned abnormal alkaline phosphatase 174, AST 79, ALT 109, total bilirubin 0.7.  Were elevated in 02/2019.  No evidence of DKA, elevated blood sugars 317, normal BUN/creatinine.  Normal PT/INR, mildly elevated lipase, patient is started on pantoprazole drip.  Given her elevated LFTs, I have told the ER physician to order ultrasound liver with Dopplers which came back as cirrhosis of liver and patent portal vein.  Started her on octreotide drip as well as received Rocephin for SBP prophylaxis. Patient is n.p.o. Serum alcohol levels undetectable. Urine drug screen negative, COVID-19 test negative, She has not seen her PCP in 2 years.  She moved to [place] in [address], has 50 year old daughter, she works as a Oncologist No other family She denies IV drug abuse, tattoos, blood transfusions, multiple sexual partners, no known hepatitis B or C  NSAIDs: None  Antiplts/Anticoagulants/Anti thrombotics: None  GI Procedures: None She denies family history of known GI cancer  Past Medical History:  Diagnosis Date  . Diabetes mellitus without complication  ([place])     History reviewed. No pertinent surgical history.  Prior to Admission medications   Medication Sig Start Date End Date Taking? Authorizing Provider  acetaminophen (TYLENOL) 500 MG tablet Take 500-1,000 mg by mouth every 6 (six) hours as needed for mild pain or fever.   Yes [provider]   Current Facility-Administered Medications:  .  0.9 %  sodium chloride infusion, , Intravenous, Continuous, Ivor Costa, MD, Last Rate: 100 mL/hr at 08/12/19 1511, 1,000 mL at 08/12/19 1511 .  [MAR Hold] cefTRIAXone (ROCEPHIN) 1 g in sodium chloride 0.9 % 100 mL IVPB, 1 g, Intravenous, Once, Earleen Newport, MD .  Doug Sou Hold] insulin aspart (novoLOG) injection 0-5 Units, 0-5 Units, Subcutaneous, QHS, Ivor Costa, MD .  Doug Sou Hold] insulin aspart (novoLOG) injection 0-9 Units, 0-9 Units, Subcutaneous, TID WC, Ivor Costa, MD .  Doug Sou Hold] morphine 2 MG/ML injection 2 mg, 2 mg, Intravenous, Q4H PRN, Ivor Costa, MD .  octreotide (SANDOSTATIN) 500 mcg in sodium chloride 0.9 % 250 mL (2 mcg/mL) infusion, 50 mcg/hr, Intravenous, Continuous, Earleen Newport, MD .  Doug Sou Hold] ondansetron [facility]) injection 4 mg, 4 mg, Intravenous, Q8H PRN, Ivor Costa, MD .  pantoprazole (PROTONIX) 80 mg in sodium chloride 0.9 % 250 mL (0.32 mg/mL) infusion, 8 mg/hr, Intravenous, Continuous, Earleen Newport, MD .  Doug Sou Hold] pantoprazole (PROTONIX) injection 40 mg, 40 mg, Intravenous, Q12H, Earleen Newport, MD   History reviewed. No pertinent family history.   Social History   Tobacco Use  . Smoking status: Never  Smoker  . Smokeless tobacco: Never Used  Substance Use Topics  . Alcohol use: Never  . Drug use: Never    Allergies as of 08/12/2019  . (No Known Allergies)    Review of Systems:    All systems reviewed and negative except where noted in HPI.   Physical Exam:  Vital signs in last 24 hours: Temp:  [98.4 F (36.9 C)] 98.4 F (36.9 C) (02/02 1512) Pulse Rate:  [64-92] 67  (02/02 1512) Resp:  [12-22] 17 (02/02 1420) BP: (104-127)/(73-85) 107/74 (02/02 1420) SpO2:  [91 %-100 %] 98 % (02/02 1512) Weight:  [54 kg] 54 kg (02/02 0806)   General:   Pleasant, cooperative in NAD Head:  Normocephalic and atraumatic. Eyes:   No icterus.   Conjunctiva pink. PERRLA. Ears:  Normal auditory acuity. Neck:  Supple; no masses or thyroidomegaly Lungs: Respirations even and unlabored. Lungs clear to auscultation bilaterally.   No wheezes, crackles, or rhonchi.  Heart:  Regular rate and rhythm;  Without murmur, clicks, rubs or gallops Abdomen:  Soft, nondistended, nontender. Normal bowel sounds. No appreciable masses or hepatomegaly.  No rebound or guarding.  Rectal:  Not performed. Msk:  Symmetrical without gross deformities.  Strength normal Extremities:  Without edema, cyanosis or clubbing. Neurologic:  Alert and oriented x3;  grossly normal neurologically. Skin:  Intact without significant lesions or rashes. Psych:  Alert and cooperative. Normal affect.  LAB RESULTS: CBC Latest Ref Rng & Units 08/12/2019 08/12/2019 07/29/2019  WBC 4.0 - 10.5 K/uL 5.7 4.7 5.7  Hemoglobin 12.0 - 15.0 g/dL 15.4(H) 15.3(H) 15.3(H)  Hematocrit 36.0 - 46.0 % 43.1 42.9 42.8  Platelets 150 - 400 K/uL 130(L) 119(L) 123(L)    BMET BMP Latest Ref Rng & Units 08/12/2019 07/29/2019 03/10/2019  Glucose 70 - 99 mg/dL 317(H) 414(H) 345(H)  BUN 6 - 20 mg/dL _0 Creatinine 0.44 - 1.00 mg/dL 0.48 0.49 0.55  Sodium 135 - 145 mmol/L 135 134(L) 134(L)  Potassium 3.5 - 5.1 mmol/L 3.9 4.2 3.6  Chloride 98 - 111 mmol/L 103 98 101  CO2 22 - 32 mmol/L 20(L) 25 23  Calcium 8.9 - 10.3 mg/dL 9.5 9.6 9.6    LFT Hepatic Function Latest Ref Rng & Units 08/12/2019 03/10/2019  Total Protein 6.5 - 8.1 g/dL 8.4(H) 8.7(H)  Albumin 3.5 - 5.0 g/dL 4.3 3.8  AST 15 - 41 U/L 79(H) 77(H)  ALT 0 - 44 U/L 109(H) 78(H)  Alk Phosphatase 38 - 126 U/L 174(H) 199(H)  Total Bilirubin 0.3 - 1.2 mg/dL 0.7 0.6      STUDIES: US LIVER DOPPLER  Result Date: 08/12/2019 CLINICAL DATA:  Elevated LFTs EXAM: DUPLEX ULTRASOUND OF LIVER TECHNIQUE: Color and duplex Doppler ultrasound was performed to evaluate the hepatic in-flow and out-flow vessels. COMPARISON:  None. FINDINGS: Main Portal Vein size: 1 cm Portal Vein Velocities (all hepatopetal): Main Prox:  27 cm/sec Main Mid: 25 cm/sec Main Dist:  22 cm/sec Right: 15 cm/sec Left: 15 cm/sec Hepatic Vein Velocities (all hepatofugal): Right:  23 cm/sec Middle:  18 cm/sec Left:  25 cm/sec IVC: Present and patent with normal respiratory phasicity. Velocity 76 cm/sec. Hepatic Artery Velocity:  55 cm/sec Splenic Vein Velocity:  14 cm/sec Spleen: 8.7 cm x 10.9 cm x 4.2 cm with a total volume of 209 cm^3 (411 cm^3 is upper limit normal) Portal Vein Occlusion/Thrombus: No Splenic Vein Occlusion/Thrombus: No Ascites: None Varices: None IMPRESSION: 1. Unremarkable hepatic vascular Doppler evaluation. Electronically Signed   By: Keturah Barre  Vernard Gambles M.D.   On: 08/12/2019 13:19   US ABDOMEN LIMITED RUQ  Result Date: 08/12/2019 CLINICAL DATA:  Elevated liver enzymes vomiting for 1 day EXAM: ULTRASOUND ABDOMEN LIMITED RIGHT UPPER QUADRANT COMPARISON:  None. FINDINGS: Gallbladder: No gallstones or wall thickening visualized. No sonographic Murphy sign noted by sonographer. Common bile duct: Diameter: 4 mm Liver: Moderately heterogeneous hepatic echotexture, suggestion of physeal widening and nodular contour. Area of variable hepatic echogenicity measuring approximately 3.5 x 3.6 cm in the anterior right hepatic lobe (image 33 of 35. Also suggested on other images along the medial aspect of the right hemi liver, perhaps in subsegment V. on some images geographic increased echogenicity is displayed. Portal vein is patent on color Doppler imaging with normal direction of blood flow towards the liver. Other: No ascites. IMPRESSION: 1. Signs of cirrhosis with possible focal hepatic lesion versus is area  of fatty infiltration. Multiphase imaging with CT or MR is suggested on follow-up. 2. No signs of cholecystitis or biliary calculi. Electronically Signed   By: Zetta Bills M.D.   On: 08/12/2019 12:19      Impression / Plan:   Vickie Smith is a 54 y.o. Hispanic female with history of uncontrolled diabetes, overweight, chronic thrombocytopenia and elevated LFTs is seen in consultation for hematemesis, epigastric pain.  New diagnosis of cirrhosis, no evidence of ascites  Hematemesis with epigastric pain Given history of cirrhosis, variceal bleed need to be ruled out Other differentials include erosive esophagitis or peptic ulcer disease or gastritis Recommend urgent EGD for further evaluation N.p.o. 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      LOS: 0 days   Sherri Sear, MD  08/12/2019, 3:42 PM   Note: This dictation was prepared with Dragon dictation along with smaller phrase technology. Any transcriptional errors that result from this process are unintentional.

## 2019-08-12 NOTE — Progress Notes (Signed)
Pt was transported to CT from CCU and back while on the vent.

## 2019-08-12 NOTE — Op Note (Signed)
[facility] Patient Name: Vickie Smith Procedure Date: 08/12/2019 3:20 PM MRN: [id number] Account #: [id number] Date of Birth: 21-Jan-1966 Admit Type: Outpatient Age: 54 Room: [facility] ENDO ROOM 1 Gender: Female Note Status: Finalized Procedure:             Upper GI endoscopy Indications:           Hematemesis, Cirrhosis with suspected esophageal                         varices Providers:             Lin Landsman MD, MD Medicines:             Monitored Anesthesia Care Complications:         No immediate complications. Estimated blood loss: None. Procedure:             Pre-Anesthesia Assessment:                        - Prior to the procedure, a History and Physical was                         performed, and patient medications and allergies were                         reviewed. The patient is competent. The risks and                         benefits of the procedure and the sedation options and                         risks were discussed with the patient. All questions                         were answered and informed consent was obtained.                         Patient identification and proposed procedure were                         verified by the physician, the nurse, the                         anesthesiologist, the anesthetist and the technician                         in the pre-procedure area in the procedure room in the                         endoscopy suite. Mental Status Examination: alert and                         oriented. Airway Examination: normal oropharyngeal                         airway and neck mobility. Respiratory Examination:                         clear to auscultation. CV Examination: normal.  Prophylactic Antibiotics: The patient does not require                         prophylactic antibiotics. Prior Anticoagulants: The                         patient has taken no previous  anticoagulant or                         antiplatelet agents. ASA Grade Assessment: III - A                         patient with severe systemic disease. After reviewing                         the risks and benefits, the patient was deemed in                         satisfactory condition to undergo the procedure. The                         anesthesia plan was to use general anesthesia.                         Immediately prior to administration of medications,                         the patient was re-assessed for adequacy to receive                         sedatives. The heart rate, respiratory rate, oxygen                         saturations, blood pressure, adequacy of pulmonary                         ventilation, and response to care were monitored                         throughout the procedure. The physical status of the                         patient was re-assessed after the procedure.                        After obtaining informed consent, the endoscope was                         passed under direct vision. Throughout the procedure,                         the patient's blood pressure, pulse, and oxygen                         saturations were monitored continuously. The Endoscope                         was introduced through the mouth, and advanced to the  second part of duodenum. The upper GI endoscopy was                         accomplished without difficulty. The patient tolerated                         the procedure well. Findings:      The duodenal bulb and second portion of the duodenum were normal.      Moderate, severe portal hypertensive gastropathy was found in the       gastric fundus.      The gastroesophageal junction and examined esophagus were normal. Impression:            - Normal duodenal bulb and second portion of the                         duodenum.                        - Portal hypertensive gastropathy.                         - Normal gastroesophageal junction and esophagus.                        - No specimens collected. Recommendation:        - Return patient to hospital ward for ongoing care.                        - Diabetic (ADA) diet today.                        - Continue present medications.                        - Return to GI office in 1 month. Procedure Code(s):     --- Professional ---                        [phone], Esophagogastroduodenoscopy, flexible,                         transoral; diagnostic, including collection of                         specimen(s) by brushing or washing, when performed                         (separate procedure) Diagnosis Code(s):     --- Professional ---                        K76.6, Portal hypertension                        K31.89, Other diseases of stomach and duodenum                        K92.0, Hematemesis                        K74.60, Unspecified cirrhosis of liver CPT copyright 2019 American Medical Association. All rights reserved. The  codes documented in this report are preliminary and upon coder review may  be revised to meet current compliance requirements. Dr. Ulyess Mort Lin Landsman MD, MD 08/12/2019 3:52:58 PM This report has been signed electronically. Number of Addenda: 0 Note Initiated On: 08/12/2019 3:20 PM Estimated Blood Loss:  Estimated blood loss: none.      [facility]

## 2019-08-12 NOTE — Anesthesia Preprocedure Evaluation (Addendum)
Anesthesia Evaluation  Patient identified by MRN, date of birth, ID band Patient awake    Reviewed: Allergy & Precautions, H&P , NPO status , Patient's Chart, lab work & pertinent test results  History of Anesthesia Complications Negative for: history of anesthetic complications  Airway Mallampati: II  TM Distance: >3 FB Neck ROM: full    Dental  (+) Chipped   Pulmonary Not current smoker,  Covid in August 2020          Cardiovascular (-) hypertension+ angina (reports intermittent chest pain for "a while," not particularly associated with activity or food) (-) Past MI, (-) Cardiac Stents and (-) CABG (-) dysrhythmias      Neuro/Psych negative neurological ROS  negative psych ROS   GI/Hepatic (+) Cirrhosis       , NASH Reportedly had hematemesis x 3 since midnight   Endo/Other  diabetes, Poorly Controlled  Renal/GU      Musculoskeletal   Abdominal   Peds  Hematology negative hematology ROS (+)   Anesthesia Other Findings Past Medical History: No date: Diabetes mellitus without complication ([place])  History reviewed. No pertinent surgical history.  BMI    Body Mass Index: 25.75 kg/m      Reproductive/Obstetrics negative OB ROS                            Anesthesia Physical Anesthesia Plan  ASA: III  Anesthesia Plan: General ETT, Rapid Sequence and Cricoid Pressure   Post-op Pain Management:    Induction:   PONV Risk Score and Plan: Ondansetron and Treatment may vary due to age or medical condition  Airway Management Planned:   Additional Equipment:   Intra-op Plan:   Post-operative Plan:   Informed Consent: I have reviewed the patients History and Physical, chart, labs and discussed the procedure including the risks, benefits and alternatives for the proposed anesthesia with the patient or authorized representative who has indicated his/her understanding and  acceptance.     Dental Advisory Given  Plan Discussed with: Anesthesiologist  Anesthesia Plan Comments:        Anesthesia Quick Evaluation

## 2019-08-12 NOTE — Transfer of Care (Signed)
Immediate Anesthesia Transfer of Care Note  Patient: Vickie Smith  Procedure(s) Performed: ESOPHAGOGASTRODUODENOSCOPY (EGD) (N/A )  Patient Location: ICU  Anesthesia Type:General  Level of Consciousness: sedated and Patient remains intubated per anesthesia plan  Airway & Oxygen Therapy: Patient re-intubated, Patient remains intubated per anesthesia plan and Patient placed on Ventilator (see vital sign flow sheet for setting)  Post-op Assessment: Report given to RN and Post -op Vital signs reviewed and stable  Post vital signs: Reviewed and stable  Last Vitals:  Vitals Value Taken Time  BP 110/85 08/12/19 1719  Temp    Pulse 75 08/12/19 1723  Resp 13 08/12/19 1723  SpO2 100 % 08/12/19 1723  Vitals shown include unvalidated device data.  Last Pain:  Vitals:   08/12/19 1512  TempSrc: Oral  PainSc: 5       Patients Stated Pain Goal: 0 (61/22/40 0180)  Complications: No apparent anesthesia complications and Patient re-intubated

## 2019-08-12 NOTE — Progress Notes (Signed)
Patient was extubated after procedure and nurse noticed blood in mouth after patient coughed. Nurse proceeded to alert CRNA and suctioning was started. Team reassessed and decided reintubation was necessary. Awaiting bed in ICU

## 2019-08-12 NOTE — Procedures (Signed)
PROCEDURE: BRONCHOSCOPY Therapeutic Aspiration of Tracheobronchial Tree  PROCEDURE DATE: 08/12/2019  TIME:  NAME:  Vickie Smith  DOB:1965/08/18  MRN: [id number] LOC:  IC10A/IC10A-AA    HOSP DAY:     Code Status Orders  (From admission, onward)         Start     Ordered   08/12/19 1720  Full code  Continuous     08/12/19 1719        Code Status History    Date Active Date Inactive Code Status Order ID Comments User Context   08/12/2019 0354 08/12/2019 1719 Full Code [id number]  Flora Lipps, MD Inpatient   08/12/2019 1145 08/12/2019 1639 Full Code [id number]  Ivor Costa, MD ED   Advance Care Planning Activity          Indications/Preliminary Diagnosis:   Consent: (Place X beside choice/s below)  The benefits, risks and possible complications of the procedure were        explained to:  ___ patient  ___ patient's family  _x__ other:___emergent________  who verbalized understanding and gave:  ___ verbal  ___ written  ___ verbal and written  ___ telephone  ___ other:________ consent.    x  Unable to obtain consent; procedure performed on emergent basis.     Other:       PRESEDATION ASSESSMENT: History and Physical has been performed. Patient meds and allergies have been reviewed. Presedation airway examination has been performed and documented. Baseline vital signs, sedation score, oxygenation status, and cardiac rhythm were reviewed. Patient was deemed to be in satisfactory condition to undergo the procedure.    PREMEDICATIONS:   Sedative/Narcotic Amt Dose   Versed  mg   Fentanyl 100 mcg  Diprivan  mg  VEC 10  mg        PROCEDURE DETAILS: Timeout performed and correct patient, name, & ID confirmed. Following prep per Pulmonary policy, appropriate sedation was administered. The Bronchoscope was inserted in to oral cavity with bite block in place. Therapeutic aspiration of Tracheobronchial tree was performed.  Airway exam proceeded with findings, technical procedures,  and specimen collection as noted below. At the end of exam the scope was withdrawn without incident. Impression and Plan as noted below.           Airway Prep (Place X beside choice below)   1% Transtracheal Lidocaine Anesthetization 7 cc   Patient prepped per Bronchoscopy Lab Policy       Insertion Route (Place X beside choice below)   Nasal   Oral  x Endotracheal Tube   Tracheostomy     Medication Amt Dose  Medication Amt Dose  Lidocaine 1%  cc  Epinephrine 1:10,000 sol  cc  Xylocaine 4%  cc  Cocaine  cc   TECHNICAL PROCEDURES: (Place X beside choice below)   Procedures  Description    None     Electrocautery     Cryotherapy     Balloon Dilatation     Bronchography     Stent Placement     Therapeutic Aspiration     Laser/Argon Plasma    Brachytherapy Catheter Placement    Foreign Body Removal         SPECIMENS (Sites): (Place X beside choice below)  Specimens Description   No Specimens Obtained     Washings   x Lavage EXTENSIVE MUCOID BLOOD CLOT EXTRACTED. LUL WITH SOURCE OF BLEEDING   Biopsies    Fine Needle Aspirates    Brushings    Sputum  FINDINGS: Massive hemoptysis from LUL ESTIMATED BLOOD LOSS: none COMPLICATIONS/RESOLUTION: none      IMPRESSION:POST-PROCEDURE DX: massive hemoptysis inflammation/infection    RECOMMENDATION/PLAN:   Plan for PULMONARY ANGIOGRAM IF REBLEEDS   Corrin Parker, M.D.  [facility]  Medical Director [place] Director [place] Department

## 2019-08-12 NOTE — Progress Notes (Signed)
Inpatient Diabetes Program Recommendations  AACE/ADA: New Consensus Statement on Inpatient Glycemic Control (2015)  Target Ranges:  Prepandial:   less than 140 mg/dL      Peak postprandial:   less than 180 mg/dL (1-2 hours)      Critically ill patients:  140 - 180 mg/dL   Lab Results  Component Value Date   VICFPF 787 (H) 07/29/2019    Review of Glycemic Control Results for Vickie Smith, Vickie Smith (MRN [id number]) as of 08/12/2019 11:39  Ref. Range 08/12/2019 08:20  Glucose Latest Ref Range: 70 - 99 mg/dL 317 (H)   Diabetes history: DM 2  Current orders for Inpatient glycemic control:  Novolog 0-9 units tid + hs  In ED being admitted.  Inpatient Diabetes Program Recommendations:    Consider Levemir 6 units Consider increasing Novolog to 0-15 units.  Thanks,  Tama Headings RN, MSN, BC-ADM Inpatient Diabetes Coordinator Team Pager [phone] (8a-5p)

## 2019-08-12 NOTE — ED Notes (Addendum)
ED TO INPATIENT HANDOFF REPORT  ED Nurse Name and Phone #: Karena Addison [phone] Name/Age/Gender Vickie Smith 54 y.o. female Room/Bed: ED06A/ED06A  Code Status   Code Status: Full Code  Home/SNF/Other Home Patient oriented to: self, place, time and situation Is this baseline? Yes   Triage Complete: Triage complete  Chief Complaint Upper GI bleeding [K92.2]  Triage Note Pt c/o having 3 episodes of bloody emesis since midnight last night with abd pain and fatigue.    Allergies No Known Allergies  Level of Care/Admitting Diagnosis ED Disposition    ED Disposition Condition [place] [facility]: [place] [[id number]]  Level of Care: Med-Surg [16]  Covid Evaluation: Confirmed COVID Negative  Date Laboratory Confirmed COVID Negative: 08/12/2019  Diagnosis: Upper GI bleeding [646803]  Admitting Physician: Ivor Costa [place]  Attending Physician: Ivor Costa [[id number]]       B Medical/Surgery History Past Medical History:  Diagnosis Date  . Diabetes mellitus without complication ([place])    History reviewed. No pertinent surgical history.   A IV Location/Drains/Wounds Patient Lines/Drains/Airways Status   Active Line/Drains/Airways    Name:   Placement date:   Placement time:   Site:   Days:   Peripheral IV 08/12/19 Right Antecubital   08/12/19    0832    Antecubital   less than 1          Intake/Output Last 24 hours No intake or output data in the 24 hours ending 08/12/19 1211  Labs/Imaging Results for orders placed or performed during the hospital encounter of 08/12/19 (from the past 48 hour(s))  CBC with Differential     Status: Abnormal   Collection Time: 08/12/19  8:20 AM  Result Value Ref Range   WBC 4.7 4.0 - 10.5 K/uL   RBC 4.88 3.87 - 5.11 MIL/uL   Hemoglobin 15.3 (H) 12.0 - 15.0 g/dL   HCT 42.9 36.0 - 46.0 %   MCV 87.9 80.0 - 100.0 fL   MCH 31.4 26.0 - 34.0 pg   MCHC 35.7 30.0 - 36.0 g/dL   RDW 12.2 11.5 - 15.5 %   Platelets  119 (L) 150 - 400 K/uL    Comment: Immature Platelet Fraction may be clinically indicated, consider ordering this additional test [id number]    nRBC 0.0 0.0 - 0.2 %   Neutrophils Relative % 47 %   Neutro Abs 2.2 1.7 - 7.7 K/uL   Lymphocytes Relative 41 %   Lymphs Abs 1.9 0.7 - 4.0 K/uL   Monocytes Relative 6 %   Monocytes Absolute 0.3 0.1 - 1.0 K/uL   Eosinophils Relative 4 %   Eosinophils Absolute 0.2 0.0 - 0.5 K/uL   Basophils Relative 2 %   Basophils Absolute 0.1 0.0 - 0.1 K/uL   Immature Granulocytes 0 %   Abs Immature Granulocytes 0.01 0.00 - 0.07 K/uL    Comment: Performed at [facility], [address] [place], [place] [address]  Comprehensive metabolic panel     Status: Abnormal   Collection Time: 08/12/19  8:20 AM  Result Value Ref Range   Sodium 135 135 - 145 mmol/L   Potassium 3.9 3.5 - 5.1 mmol/L   Chloride 103 98 - 111 mmol/L   CO2 20 (L) 22 - 32 mmol/L   Glucose, Bld 317 (H) 70 - 99 mg/dL   BUN 16 6 - 20 mg/dL   Creatinine, Ser 0.48 0.44 - 1.00 mg/dL   Calcium 9.5 8.9 - 10.3  mg/dL   Total Protein 8.4 (H) 6.5 - 8.1 g/dL   Albumin 4.3 3.5 - 5.0 g/dL   AST 79 (H) 15 - 41 U/L   ALT 109 (H) 0 - 44 U/L   Alkaline Phosphatase 174 (H) 38 - 126 U/L   Total Bilirubin 0.7 0.3 - 1.2 mg/dL   GFR calc non Af Amer >60 >60 mL/min   GFR calc Af Amer >60 >60 mL/min   Anion gap 12 5 - 15    Comment: Performed at [facility], [address] [place], [place] [address]  Lipase, blood     Status: Abnormal   Collection Time: 08/12/19  8:20 AM  Result Value Ref Range   Lipase 56 (H) 11 - 51 U/L    Comment: Performed at [facility], [address] [place], [place] [address]  Protime-INR     Status: None   Collection Time: 08/12/19  8:20 AM  Result Value Ref Range   Prothrombin Time 13.3 11.4 - 15.2 seconds   INR 1.0 0.8 - 1.2    Comment: (NOTE) INR goal varies based on device and disease states. Performed at [facility], [address] [place], [place] [address]   Urinalysis, Complete w Microscopic     Status: Abnormal   Collection Time: 08/12/19  8:28 AM  Result Value Ref Range   Color, Urine YELLOW (A) YELLOW   APPearance CLEAR (A) CLEAR   Specific Gravity, Urine 1.020 1.005 - 1.030   pH 5.0 5.0 - 8.0   Glucose, UA >=500 (A) NEGATIVE mg/dL   Hgb urine dipstick NEGATIVE NEGATIVE   Bilirubin Urine NEGATIVE NEGATIVE   Ketones, ur NEGATIVE NEGATIVE mg/dL   Protein, ur NEGATIVE NEGATIVE mg/dL   Nitrite NEGATIVE NEGATIVE   Leukocytes,Ua NEGATIVE NEGATIVE   WBC, UA 0-5 0 - 5 WBC/hpf   Bacteria, UA NONE SEEN NONE SEEN   Squamous Epithelial / LPF 0-5 0 - 5   Mucus PRESENT     Comment: Performed at [facility], [address] [place], [place] [address]  Ethanol     Status: None   Collection Time: 08/12/19  8:28 AM  Result Value Ref Range   Alcohol, Ethyl (B) <10 <10 mg/dL    Comment: (NOTE) Lowest detectable limit for serum alcohol is 10 mg/dL. For medical purposes only. Performed at [facility], [address], [place], [place] [address]   Respiratory Panel by RT PCR (Flu A&B, Covid) - Nasopharyngeal Swab     Status: None   Collection Time: 08/12/19 10:34 AM   Specimen: Nasopharyngeal Swab  Result Value Ref Range   SARS Coronavirus 2 by RT PCR NEGATIVE NEGATIVE    Comment: (NOTE) SARS-CoV-2 target nucleic acids are NOT DETECTED. The SARS-CoV-2 RNA is generally detectable in upper respiratoy specimens during the acute phase of infection. The lowest concentration of SARS-CoV-2 viral copies this assay can detect is 131 copies/mL. A negative result does not preclude SARS-Cov-2 infection and should not be used as the sole basis for treatment or other patient management decisions. A negative result may occur with  improper specimen collection/handling, submission of specimen other than nasopharyngeal swab, presence of viral mutation(s) within the areas targeted by this assay, and  inadequate number of viral copies (<131 copies/mL). A negative result must be combined with clinical observations, patient history, and epidemiological information. The expected result is Negative. Fact Sheet for Patients:  PinkCheek.be Fact Sheet for Healthcare Providers:  GravelBags.it This test is  not yet ap proved or cleared by the [place] and  has been authorized for detection and/or diagnosis of SARS-CoV-2 by FDA under an Emergency Use Authorization (EUA). This EUA will remain  in effect (meaning this test can be used) for the duration of the COVID-19 declaration under Section 564(b)(1) of the Act, 21 U.S.C. section 360bbb-3(b)(1), unless the authorization is terminated or revoked sooner.    Influenza A by PCR NEGATIVE NEGATIVE   Influenza B by PCR NEGATIVE NEGATIVE    Comment: (NOTE) The Xpert Xpress SARS-CoV-2/FLU/RSV assay is intended as an aid in  the diagnosis of influenza from Nasopharyngeal swab specimens and  should not be used as a sole basis for treatment. Nasal washings and  aspirates are unacceptable for Xpert Xpress SARS-CoV-2/FLU/RSV  testing. Fact Sheet for Patients: PinkCheek.be Fact Sheet for Healthcare Providers: GravelBags.it This test is not yet approved or cleared by the [place] FDA and  has been authorized for detection and/or diagnosis of SARS-CoV-2 by  FDA under an Emergency Use Authorization (EUA). This EUA will remain  in effect (meaning this test can be used) for the duration of the  Covid-19 declaration under Section 564(b)(1) of the Act, 21  U.S.C. section 360bbb-3(b)(1), unless the authorization is  terminated or revoked. Performed at [facility], [address] [place], [place] [address]    No results found.  Pending Labs Unresulted Labs (From admission, onward)    Start     Ordered   08/13/19  0500  Comprehensive metabolic panel  Tomorrow morning,   STAT     08/12/19 1145   08/12/19 1146  APTT  Once,   STAT     08/12/19 1145   08/12/19 1144  HIV Antibody (routine testing w rflx)  (HIV Antibody (Routine testing w reflex) panel)  Once,   STAT     08/12/19 1145   08/12/19 1054  Hepatitis panel, acute  Once,   STAT     08/12/19 1054   08/12/19 1053  CBC  Once,   R     08/12/19 1053   08/12/19 1047  Hemoglobin A1c  Add-on,   AD    Comments: To assess prior glycemic control    08/12/19 1047   08/12/19 1042  Urine Drug Screen, Qualitative ([place] only)  Once,   STAT     08/12/19 1042   08/12/19 1038  CBC  Now then every 6 hours,   STAT     08/12/19 1037          Vitals/Pain Today's Vitals   08/12/19 0806 08/12/19 1000 08/12/19 1030 08/12/19 1100  BP:  104/74 111/83 127/85  Pulse:  64 69 73  Resp:  (!) 22 13 12   Temp:      TempSrc:      SpO2:  96% 91% 92%  Weight: 54 kg     Height: 4' 9"  (1.448 m)       Isolation Precautions No active isolations  Medications Medications  0.9 %  sodium chloride infusion (has no administration in time range)  ondansetron (ZOFRAN) injection 4 mg (has no administration in time range)  insulin aspart (novoLOG) injection 0-9 Units (has no administration in time range)  insulin aspart (novoLOG) injection 0-5 Units (has no administration in time range)  octreotide (SANDOSTATIN) 500 mcg in sodium chloride 0.9 % 250 mL (2 mcg/mL) infusion (has no administration in time range)  cefTRIAXone (ROCEPHIN) 1 g in sodium chloride 0.9 % 100 mL IVPB (has no  administration in time range)  pantoprazole (PROTONIX) 80 mg in sodium chloride 0.9 % 250 mL (0.32 mg/mL) infusion (has no administration in time range)  pantoprazole (PROTONIX) injection 40 mg (has no administration in time range)  morphine 2 MG/ML injection 2 mg (has no administration in time range)  ondansetron (ZOFRAN) injection 4 mg (4 mg Intravenous Given 08/12/19 0824)  pantoprazole (PROTONIX)  injection 40 mg (40 mg Intravenous Given 08/12/19 0824)  alum & mag hydroxide-simeth (MAALOX/MYLANTA) 200-200-20 MG/5ML suspension 30 mL (30 mLs Oral Given 08/12/19 1035)    And  lidocaine (XYLOCAINE) 2 % viscous mouth solution 15 mL (15 mLs Oral Given 08/12/19 1035)    Mobility walks Low fall risk   Focused Assessments    R Recommendations: See Admitting Provider Note  Report given to: Peggye Fothergill, RN

## 2019-08-12 NOTE — Anesthesia Procedure Notes (Signed)
Procedure Name: Intubation Date/Time: 08/12/2019 3:43 PM Performed by: [name], CRNA Pre-anesthesia Checklist: Patient identified, Patient being monitored, Timeout performed, Emergency Drugs available and Suction available Patient Re-evaluated:Patient Re-evaluated prior to induction Oxygen Delivery Method: Circle system utilized Preoxygenation: Pre-oxygenation with 100% oxygen Induction Type: IV induction and Rapid sequence Ventilation: Mask ventilation without difficulty Laryngoscope Size: 3 and McGraph Grade View: Grade I Tube type: Oral Tube size: 7.0 mm Number of attempts: 1 Airway Equipment and Method: Stylet Placement Confirmation: ETT inserted through vocal cords under direct vision,  positive ETCO2 and breath sounds checked- equal and bilateral Secured at: 18 cm Tube secured with: Tape Dental Injury: Teeth and Oropharynx as per pre-operative assessment

## 2019-08-12 NOTE — Anesthesia Procedure Notes (Signed)
Procedure Name: Intubation Date/Time: 08/12/2019 4:31 PM Performed by: Lia Foyer, CRNA Pre-anesthesia Checklist: Patient identified, Emergency Drugs available, Suction available and Patient being monitored Patient Re-evaluated:Patient Re-evaluated prior to induction Oxygen Delivery Method: Circle system utilized Preoxygenation: Pre-oxygenation with 100% oxygen Induction Type: IV induction, Rapid sequence and Cricoid Pressure applied Laryngoscope Size: McGraph and 3 Grade View: Grade I Tube type: Oral Tube size: 7.5 mm Number of attempts: 1 Airway Equipment and Method: Stylet and Oral airway Placement Confirmation: ETT inserted through vocal cords under direct vision,  positive ETCO2 and breath sounds checked- equal and bilateral Secured at: 20 cm Tube secured with: Tape Dental Injury: Teeth and Oropharynx as per pre-operative assessment  Comments: Patient re-intubated to protect airway after bloody secretions noted upon extubation.  Patient is stable.

## 2019-08-12 NOTE — H&P (Signed)
Name: Vickie Smith MRN: [id number] DOB: 11-12-1965     CONSULTATION DATE: 08/12/2019 REFERRING MD : Blaine Hamper  CHIEF COMPLAINT:  resp failure  HISTORY OF PRESENT ILLNESS:  presents to the ED for what was presumed hematemesis. Patient reports 3 episodes of bloody emesis since midnight last night with abdominal pain and fatigue.  He describes a vague abdominal discomfort, has not had this happen before.  She denies fevers, chills, cough or diarrhea.  She has had some throat discomfort.  She underwent EGD(2/2) and was reported to have NL findings   admitted to ICU for severe resp failure from what seems to be severe hemoptysis  Patient was emergently intubated Patient critically ill  She will need CT chest to assess for PE and lung findings   Dx with COVID 19 02/2019    PAST MEDICAL HISTORY :   has a past medical history of Diabetes mellitus without complication ([place]).  has no past surgical history on file. Prior to Admission medications   Medication Sig Start Date End Date Taking? Authorizing Provider  acetaminophen (TYLENOL) 500 MG tablet Take 500-1,000 mg by mouth every 6 (six) hours as needed for mild pain or fever.   Yes [provider]   No Known Allergies  FAMILY HISTORY:  family history is not on file. SOCIAL HISTORY:  reports that she has never smoked. She has never used smokeless tobacco. She reports that she does not drink alcohol or use drugs.  REVIEW OF SYSTEMS:   Unable to obtain due to critical illness   VITAL SIGNS: Temp:  [98.4 F (36.9 C)] 98.4 F (36.9 C) (02/02 1512) Pulse Rate:  [64-92] 67 (02/02 1512) Resp:  [12-22] 17 (02/02 1420) BP: (104-127)/(73-85) 107/74 (02/02 1420) SpO2:  [91 %-100 %] 98 % (02/02 1512) Weight:  [54 kg] 54 kg (02/02 0806)   No intake/output data recorded. Total I/O In: 900 [I.V.:900] Out: 50 [Blood:50]   SpO2: 98 %   Physical Examination:  GENERAL:critically ill appearing, +resp distress HEAD:  Normocephalic, atraumatic.  EYES: Pupils equal, round, reactive to light.  No scleral icterus.  MOUTH: Moist mucosal membrane. NECK: Supple. No JVD.  PULMONARY: +rhonchi, +wheezing CARDIOVASCULAR: S1 and S2. Regular rate and rhythm. No murmurs, rubs, or gallops.  GASTROINTESTINAL: Soft, nontender, -distended.  Positive bowel sounds.  MUSCULOSKELETAL: No swelling, clubbing, or edema.  NEUROLOGIC: obtunded SKIN:intact,warm,dry   MEDICATIONS: I have reviewed all medications and confirmed regimen as documented   CULTURE RESULTS   Recent Results (from the past 240 hour(s))  Respiratory Panel by RT PCR (Flu A&B, Covid) - Nasopharyngeal Swab     Status: None   Collection Time: 08/12/19 10:34 AM   Specimen: Nasopharyngeal Swab  Result Value Ref Range Status   SARS Coronavirus 2 by RT PCR NEGATIVE NEGATIVE Final    Comment: (NOTE) SARS-CoV-2 target nucleic acids are NOT DETECTED. The SARS-CoV-2 RNA is generally detectable in upper respiratoy specimens during the acute phase of infection. The lowest concentration of SARS-CoV-2 viral copies this assay can detect is 131 copies/mL. A negative result does not preclude SARS-Cov-2 infection and should not be used as the sole basis for treatment or other patient management decisions. A negative result may occur with  improper specimen collection/handling, submission of specimen other than nasopharyngeal swab, presence of viral mutation(s) within the areas targeted by this assay, and inadequate number of viral copies (<131 copies/mL). A negative result must be combined with clinical observations, patient history, and epidemiological information. The expected result is  Negative. Fact Sheet for Patients:  PinkCheek.be Fact Sheet for Healthcare Providers:  GravelBags.it This test is not yet ap proved or cleared by the [place] FDA and  has been authorized for detection and/or  diagnosis of SARS-CoV-2 by FDA under an Emergency Use Authorization (EUA). This EUA will remain  in effect (meaning this test can be used) for the duration of the COVID-19 declaration under Section 564(b)(1) of the Act, 21 U.S.C. section 360bbb-3(b)(1), unless the authorization is terminated or revoked sooner.    Influenza A by PCR NEGATIVE NEGATIVE Final   Influenza B by PCR NEGATIVE NEGATIVE Final    Comment: (NOTE) The Xpert Xpress SARS-CoV-2/FLU/RSV assay is intended as an aid in  the diagnosis of influenza from Nasopharyngeal swab specimens and  should not be used as a sole basis for treatment. Nasal washings and  aspirates are unacceptable for Xpert Xpress SARS-CoV-2/FLU/RSV  testing. Fact Sheet for Patients: PinkCheek.be Fact Sheet for Healthcare Providers: GravelBags.it This test is not yet approved or cleared by the [place] FDA and  has been authorized for detection and/or diagnosis of SARS-CoV-2 by  FDA under an Emergency Use Authorization (EUA). This EUA will remain  in effect (meaning this test can be used) for the duration of the  Covid-19 declaration under Section 564(b)(1) of the Act, 21  U.S.C. section 360bbb-3(b)(1), unless the authorization is  terminated or revoked. Performed at [facility], [address] [place], [place] [address]           IMAGING    US LIVER DOPPLER  Result Date: 08/12/2019 CLINICAL DATA:  Elevated LFTs EXAM: DUPLEX ULTRASOUND OF LIVER TECHNIQUE: Color and duplex Doppler ultrasound was performed to evaluate the hepatic in-flow and out-flow vessels. COMPARISON:  None. FINDINGS: Main Portal Vein size: 1 cm Portal Vein Velocities (all hepatopetal): Main Prox:  27 cm/sec Main Mid: 25 cm/sec Main Dist:  22 cm/sec Right: 15 cm/sec Left: 15 cm/sec Hepatic Vein Velocities (all hepatofugal): Right:  23 cm/sec Middle:  18 cm/sec Left:  25 cm/sec IVC: Present and patent with  normal respiratory phasicity. Velocity 76 cm/sec. Hepatic Artery Velocity:  55 cm/sec Splenic Vein Velocity:  14 cm/sec Spleen: 8.7 cm x 10.9 cm x 4.2 cm with a total volume of 209 cm^3 (411 cm^3 is upper limit normal) Portal Vein Occlusion/Thrombus: No Splenic Vein Occlusion/Thrombus: No Ascites: None Varices: None IMPRESSION: 1. Unremarkable hepatic vascular Doppler evaluation. Electronically Signed   By: Lucrezia Europe M.D.   On: 08/12/2019 13:19   US ABDOMEN LIMITED RUQ  Result Date: 08/12/2019 CLINICAL DATA:  Elevated liver enzymes vomiting for 1 day EXAM: ULTRASOUND ABDOMEN LIMITED RIGHT UPPER QUADRANT COMPARISON:  None. FINDINGS: Gallbladder: No gallstones or wall thickening visualized. No sonographic Murphy sign noted by sonographer. Common bile duct: Diameter: 4 mm Liver: Moderately heterogeneous hepatic echotexture, suggestion of physeal widening and nodular contour. Area of variable hepatic echogenicity measuring approximately 3.5 x 3.6 cm in the anterior right hepatic lobe (image 33 of 35. Also suggested on other images along the medial aspect of the right hemi liver, perhaps in subsegment V. on some images geographic increased echogenicity is displayed. Portal vein is patent on color Doppler imaging with normal direction of blood flow towards the liver. Other: No ascites. IMPRESSION: 1. Signs of cirrhosis with possible focal hepatic lesion versus is area of fatty infiltration. Multiphase imaging with CT or MR is suggested on follow-up. 2. No signs of cholecystitis or biliary calculi. Electronically Signed   By: Cay Schillings  Wile M.D.   On: 08/12/2019 12:19        Indwelling Urinary Catheter continued, requirement due to   Reason to continue Indwelling Urinary Catheter strict Intake/Output monitoring for hemodynamic instability         Ventilator continued, requirement due to severe respiratory failure   Ventilator Sedation RASS 0 to -2      ASSESSMENT AND PLAN SYNOPSIS   Severe ACUTE  Hypoxic and Hypercapnic Respiratory Failure from severe hemoptysis, possible dx could be PE, AVM from previous COVID 19 scarring/pneumonia, r/o TB -continue Full MV support -continue Bronchodilator Therapy -Wean Fio2 and PEEP as tolerated -will perform SAT/SBT when respiratory parameters are met CT chest r/o PE   NEUROLOGY - intubated and sedated - minimal sedation to achieve a RASS goal: -1   CARDIAC ICU monitoring  ID -continue IV abx as prescibed -follow up cultures  GI GI PROPHYLAXIS as indicated  NUTRITIONAL STATUS DIET-->TF's as tolerated Constipation protocol as indicated   ENDO - will use ICU hypoglycemic\Hyperglycemia protocol if needed    ELECTROLYTES -follow labs as needed -replace as needed -pharmacy consultation and following   DVT/GI PRX ordered TRANSFUSIONS AS NEEDED MONITOR FSBS ASSESS the need for LABS    Critical Care Time devoted to patient care services described in this note is 32 minutes.   Overall, patient is critically ill, prognosis is guarded.  Patient with Multiorgan failure and at high risk for cardiac arrest and death.    Corrin Parker, M.D.  [facility]  Medical Director [place] [facility] Cardio-Pulmonary Department

## 2019-08-12 NOTE — ED Provider Notes (Signed)
[facility] Emergency Department Provider Note       Time seen: ----------------------------------------- 8:14 AM on 08/12/2019 -----------------------------------------   I have reviewed the triage vital signs and the nursing notes.  HISTORY  Chief Complaint Hemoptysis    HPI Vickie Smith is a 54 y.o. female with a history of diabetes who presents to the ED for hematemesis.  Patient reports 3 episodes of bloody emesis since midnight last night with abdominal pain and fatigue.  He describes a vague abdominal discomfort, has not had this happen before.  She denies fevers, chills, cough or diarrhea.  She has had some throat discomfort.  Past Medical History:  Diagnosis Date  . Diabetes mellitus without complication ([place])     There are no problems to display for this patient.   History reviewed. No pertinent surgical history.  Allergies Patient has no known allergies.  Social History Social History   Tobacco Use  . Smoking status: Never Smoker  . Smokeless tobacco: Never Used  Substance Use Topics  . Alcohol use: Never  . Drug use: Never   Review of Systems Constitutional: Negative for fever. Cardiovascular: Negative for chest pain. Respiratory: Negative for shortness of breath. Gastrointestinal: Positive for abdominal pain, hematemesis Musculoskeletal: Negative for back pain. Skin: Negative for rash. Neurological: Negative for headaches, focal weakness or numbness.  All systems negative/normal/unremarkable except as stated in the HPI  ____________________________________________   PHYSICAL EXAM:  VITAL SIGNS: ED Triage Vitals  Enc Vitals Group     BP 08/12/19 0805 118/74     Pulse Rate 08/12/19 0805 78     Resp 08/12/19 0805 16     Temp 08/12/19 0805 98.4 F (36.9 C)     Temp Source 08/12/19 0805 Oral     SpO2 08/12/19 0805 97 %     Weight 08/12/19 0806 119 lb (54 kg)     Height 08/12/19 0806 4' 9"  (1.448 m)     Head  Circumference --      Peak Flow --      Pain Score --      Pain Loc --      Pain Edu? --      Excl. in [place]? --    Constitutional: Alert and oriented. Well appearing and in no distress. Eyes: Conjunctivae are normal. Normal extraocular movements. ENT      Head: Normocephalic and atraumatic.      Nose: No congestion/rhinnorhea.      Mouth/Throat: Mucous membranes are moist.      Neck: No stridor. Cardiovascular: Normal rate, regular rhythm. No murmurs, rubs, or gallops. Respiratory: Normal respiratory effort without tachypnea nor retractions. Breath sounds are clear and equal bilaterally. No wheezes/rales/rhonchi. Gastrointestinal: Soft and nontender. Normal bowel sounds Musculoskeletal: Nontender with normal range of motion in extremities. No lower extremity tenderness nor edema. Neurologic:  Normal speech and language. No gross focal neurologic deficits are appreciated.  Skin:  Skin is warm, dry and intact. No rash noted. Psychiatric: Mood and affect are normal. Speech and behavior are normal.  ____________________________________________  ED COURSE:  As part of my medical decision making, I reviewed the following data within the [place] History obtained from family if available, nursing notes, old chart and ekg, as well as notes from prior ED visits. Patient presented for hematemesis, we will assess with labs and imaging as indicated at this time.   Procedures  [name] was evaluated in Emergency Department on 08/12/2019 for the symptoms described  in the history of present illness. She was evaluated in the context of the global COVID-19 pandemic, which necessitated consideration that the patient might be at risk for infection with the SARS-CoV-2 virus that causes COVID-19. Institutional protocols and algorithms that pertain to the evaluation of patients at risk for COVID-19 are in a state of rapid change based on information released by regulatory bodies  including the CDC and federal and state organizations. These policies and algorithms were followed during the patient's care in the ED.  ____________________________________________   LABS (pertinent positives/negatives)  Labs Reviewed  CBC WITH DIFFERENTIAL/PLATELET - Abnormal; Notable for the following components:      Result Value   Hemoglobin 15.3 (*)    Platelets 119 (*)    All other components within normal limits  COMPREHENSIVE METABOLIC PANEL - Abnormal; Notable for the following components:   CO2 20 (*)    Glucose, Bld 317 (*)    Total Protein 8.4 (*)    AST 79 (*)    ALT 109 (*)    Alkaline Phosphatase 174 (*)    All other components within normal limits  LIPASE, BLOOD - Abnormal; Notable for the following components:   Lipase 56 (*)    All other components within normal limits  URINALYSIS, COMPLETE (UACMP) WITH MICROSCOPIC - Abnormal; Notable for the following components:   Color, Urine YELLOW (*)    APPearance CLEAR (*)    Glucose, UA >=500 (*)    All other components within normal limits  PROTIME-INR   ____________________________________________   DIFFERENTIAL DIAGNOSIS   Peptic ulcer disease, gastritis, esophagitis, esophageal varices, coagulopathy, gastroenteritis  FINAL ASSESSMENT AND PLAN  Hematemesis   Plan: The patient had presented for 3 episodes of hematemesis. Patient's labs did reveal some hyperglycemia and some elevations in her liver function tests. She did not require any imaging during her visit. She does describe 3 large episodes of hematemesis where she vomited up over a couple of blood. Coags are unremarkable. I will discuss with GI and recommend admission. She has received IV Protonix.   Laurence Aly, MD    Note: This note was generated in part or whole with voice recognition software. Voice recognition is usually quite accurate but there are transcription errors that can and very often do occur. I apologize for any typographical  errors that were not detected and corrected.     Earleen Newport, MD 08/12/19 1026

## 2019-08-12 NOTE — Progress Notes (Signed)
RT assisted with bedside bronchoscopy. Therapeutic scope # [id number] used for procedure. Time out performed by Dr. Mortimer Fries prior to procedure.

## 2019-08-12 NOTE — ED Triage Notes (Signed)
Pt c/o having 3 episodes of bloody emesis since midnight last night with abd pain and fatigue.

## 2019-08-13 ENCOUNTER — Encounter: Payer: Self-pay | Admitting: *Deleted

## 2019-08-13 ENCOUNTER — Inpatient Hospital Stay: Payer: Self-pay

## 2019-08-13 DIAGNOSIS — R945 Abnormal results of liver function studies: Secondary | ICD-10-CM

## 2019-08-13 DIAGNOSIS — J9601 Acute respiratory failure with hypoxia: Secondary | ICD-10-CM

## 2019-08-13 LAB — CBC
HCT: 39.2 % (ref 36.0–46.0)
HCT: 37.3 % (ref 36.0–46.0)
HCT: 37.9 % (ref 36.0–46.0)
HCT: 36.3 % (ref 36.0–46.0)
Hemoglobin: 13.4 g/dL (ref 12.0–15.0)
Hemoglobin: 13.1 g/dL (ref 12.0–15.0)
Hemoglobin: 13.2 g/dL (ref 12.0–15.0)
Hemoglobin: 12.5 g/dL (ref 12.0–15.0)
MCH: 31.1 pg (ref 26.0–34.0)
MCH: 31.6 pg (ref 26.0–34.0)
MCH: 31.8 pg (ref 26.0–34.0)
MCH: 31.7 pg (ref 26.0–34.0)
MCHC: 34.2 g/dL (ref 30.0–36.0)
MCHC: 35.1 g/dL (ref 30.0–36.0)
MCHC: 34.8 g/dL (ref 30.0–36.0)
MCHC: 34.4 g/dL (ref 30.0–36.0)
MCV: 91 fL (ref 80.0–100.0)
MCV: 90.1 fL (ref 80.0–100.0)
MCV: 91.3 fL (ref 80.0–100.0)
MCV: 92.1 fL (ref 80.0–100.0)
Platelets: 95 10*3/uL — ABNORMAL LOW (ref 150–400)
Platelets: 101 10*3/uL — ABNORMAL LOW (ref 150–400)
Platelets: 109 10*3/uL — ABNORMAL LOW (ref 150–400)
Platelets: 104 10*3/uL — ABNORMAL LOW (ref 150–400)
RBC: 4.31 MIL/uL (ref 3.87–5.11)
RBC: 4.14 MIL/uL (ref 3.87–5.11)
RBC: 4.15 MIL/uL (ref 3.87–5.11)
RBC: 3.94 MIL/uL (ref 3.87–5.11)
RDW: 12.6 % (ref 11.5–15.5)
RDW: 12.6 % (ref 11.5–15.5)
RDW: 12.5 % (ref 11.5–15.5)
RDW: 12.7 % (ref 11.5–15.5)
WBC: 5.7 10*3/uL (ref 4.0–10.5)
WBC: 9.4 10*3/uL (ref 4.0–10.5)
WBC: 12.1 10*3/uL — ABNORMAL HIGH (ref 4.0–10.5)
WBC: 12.1 10*3/uL — ABNORMAL HIGH (ref 4.0–10.5)
nRBC: 0 % (ref 0.0–0.2)
nRBC: 0 % (ref 0.0–0.2)
nRBC: 0 % (ref 0.0–0.2)
nRBC: 0 % (ref 0.0–0.2)

## 2019-08-13 LAB — BLOOD GAS, ARTERIAL
Acid-base deficit: 2.8 mmol/L — ABNORMAL HIGH (ref 0.0–2.0)
Bicarbonate: 21.9 mmol/L (ref 20.0–28.0)
FIO2: 0.55
MECHVT: 400 mL
O2 Saturation: 99.9 %
PEEP: 5 cmH2O
Patient temperature: 37
RATE: 14 resp/min
pCO2 arterial: 37 mmHg (ref 32.0–48.0)
pH, Arterial: 7.38 (ref 7.350–7.450)
pO2, Arterial: 268 mmHg — ABNORMAL HIGH (ref 83.0–108.0)

## 2019-08-13 LAB — TRIGLYCERIDES: Triglycerides: 270 mg/dL — ABNORMAL HIGH (ref <150)

## 2019-08-13 LAB — COMPREHENSIVE METABOLIC PANEL
ALT: 95 U/L — ABNORMAL HIGH (ref 0–44)
AST: 86 U/L — ABNORMAL HIGH (ref 15–41)
Albumin: 3.6 g/dL (ref 3.5–5.0)
Alkaline Phosphatase: 110 U/L (ref 38–126)
Anion gap: 10 (ref 5–15)
BUN: 18 mg/dL (ref 6–20)
CO2: 22 mmol/L (ref 22–32)
Calcium: 8.6 mg/dL — ABNORMAL LOW (ref 8.9–10.3)
Chloride: 107 mmol/L (ref 98–111)
Creatinine, Ser: 0.35 mg/dL — ABNORMAL LOW (ref 0.44–1.00)
GFR calc Af Amer: >60 mL/min (ref >60)
GFR calc non Af Amer: >60 mL/min (ref >60)
Glucose, Bld: 301 mg/dL — ABNORMAL HIGH (ref 70–99)
Potassium: 3.9 mmol/L (ref 3.5–5.1)
Sodium: 139 mmol/L (ref 135–145)
Total Bilirubin: 0.6 mg/dL (ref 0.3–1.2)
Total Protein: 7.5 g/dL (ref 6.5–8.1)

## 2019-08-13 LAB — GLUCOSE, CAPILLARY
Glucose-Capillary: 245 mg/dL — ABNORMAL HIGH (ref 70–99)
Glucose-Capillary: 204 mg/dL — ABNORMAL HIGH (ref 70–99)
Glucose-Capillary: 225 mg/dL — ABNORMAL HIGH (ref 70–99)
Glucose-Capillary: 155 mg/dL — ABNORMAL HIGH (ref 70–99)

## 2019-08-13 MED ORDER — INSULIN ASPART 100 UNIT/ML ~~LOC~~ SOLN
0.0000 [IU] | SUBCUTANEOUS | Status: DC
  Administered 2019-08-13 (×2): 3 [IU] via SUBCUTANEOUS
  Administered 2019-08-14: 05:00:00 5 [IU] via SUBCUTANEOUS
  Administered 2019-08-14: 09:00:00 2 [IU] via SUBCUTANEOUS
  Administered 2019-08-14: 12:00:00 3 [IU] via SUBCUTANEOUS
  Administered 2019-08-14: 02:00:00 5 [IU] via SUBCUTANEOUS
  Administered 2019-08-14: 3 [IU] via SUBCUTANEOUS
  Administered 2019-08-15: 05:00:00 5 [IU] via SUBCUTANEOUS
  Administered 2019-08-15: 3 [IU] via SUBCUTANEOUS
  Administered 2019-08-15: 2 [IU] via SUBCUTANEOUS
  Filled 2019-08-13 (×10): qty 1

## 2019-08-13 MED ORDER — FENTANYL BOLUS VIA INFUSION
50.0000 ug | INTRAVENOUS | Status: DC | PRN
  Filled 2019-08-13: qty 100

## 2019-08-13 MED ORDER — MIDAZOLAM HCL 2 MG/2ML IJ SOLN
2.0000 mg | Freq: Once | INTRAMUSCULAR | Status: AC
  Administered 2019-08-13: 05:00:00 2 mg via INTRAVENOUS
  Filled 2019-08-13: qty 2

## 2019-08-13 MED ORDER — SODIUM CHLORIDE 0.9 % IV SOLN
2.0000 g | INTRAVENOUS | Status: DC
  Administered 2019-08-13 – 2019-08-16 (×3): 2 g via INTRAVENOUS
  Filled 2019-08-13 (×3): qty 2
  Filled 2019-08-13: qty 20

## 2019-08-13 NOTE — Progress Notes (Signed)
Patient was extubated successfully at 1255 this afternoon.  She was alert and oriented x4.  Her vitals were WDL.  Stopped Fentanyl.  Patient complained of no pain.  She was assisted to the [facility] and her UOP was charted.  Her urine is amber.  She had a teleconference with her family around 3.  She speaks some english and appears to be in high spirits.  She consumes clear liquids without any sign of coughing or aspiration after swallowing.

## 2019-08-13 NOTE — Progress Notes (Signed)
Patient upon assessment is alert to voice and stimulation with a RASS -1.  She is currently ventilated PRVC 28%FiO2 Peep 5 RR 14 saturating 100% with a good wave pleth.  She remains bradycardic high 50's.  Pressure 106/70 (82). ETCO2 21.  She has NS 163m/hr.  Proprofol 38m/kg/min and Fentanyl 12572mhr.  Suctioned patients mouth after performing mouth care.  ETT 20cm at lips.  Turned patient to her right side.  Observed her purewick in place and tested for suction.  Suction is good but patient isnt making any urine.  Will bladder scan and follow up with physician with results.  Delivered 3units insulin for 245 fsbs

## 2019-08-13 NOTE — Progress Notes (Signed)
Patient Alert and awaiting extubation.  Contacted RT to meet in patients room for ETT extubation.

## 2019-08-13 NOTE — Progress Notes (Signed)
Inpatient Diabetes Program Recommendations  AACE/ADA: New Consensus Statement on Inpatient Glycemic Control (2015)  Target Ranges:  Prepandial:   less than 140 mg/dL      Peak postprandial:   less than 180 mg/dL (1-2 hours)      Critically ill patients:  140 - 180 mg/dL   Lab Results  Component Value Date   GLUCAP 245 (H) 08/13/2019   HGBA1C 11.5 (H) 08/12/2019    Review of Glycemic Control Results for DECEMBER, HEDTKE (MRN [id number]) as of 08/13/2019 09:38  Ref. Range 08/12/2019 15:11 08/12/2019 17:42 08/12/2019 22:04 08/13/2019 07:57  Glucose-Capillary Latest Ref Range: 70 - 99 mg/dL 189 (H) 155 (H) 242 (H) 245 (H)   Diabetes history: DM 2 Outpatient Diabetes medications: none listed Current orders for Inpatient glycemic control:  Novolog 0-9 units tid + hs  Solumedrol 40 mg Q12 hours A1c 11.5% will speak w/pt when medically appropriate  Inpatient Diabetes Program Recommendations:    Consider Novolog 0-15 units Q4 hours.  Thanks,  Tama Headings RN, MSN, BC-ADM Inpatient Diabetes Coordinator Team Pager [phone] (8a-5p)

## 2019-08-13 NOTE — Progress Notes (Signed)
Patient extubated to 2L [place], per MD requests, with no complications.

## 2019-08-13 NOTE — Progress Notes (Signed)
Follow up - Critical Care Medicine Note  Patient Details:    Vickie Smith is an 54 y.o. female lifelong never smoker, native of [place], presented for presumed hematemesis later found out to be massive hemoptysis (approximately 250 mL) patient was intubated and mechanically ventilated.  Had bronchoscopy 2/2.  Lines, Airways, Drains:    Anti-infectives:  Anti-infectives (From admission, onward)   Start     Dose/Rate Route Frequency Ordered Stop   08/13/19 1800  cefTRIAXone (ROCEPHIN) 2 g in sodium chloride 0.9 % 100 mL IVPB     2 g 200 mL/hr over 30 Minutes Intravenous Every 24 hours 08/13/19 0111 08/17/19 1759   08/12/19 2200  cefTRIAXone (ROCEPHIN) 2 g in sodium chloride 0.9 % 100 mL IVPB  Status:  Discontinued     2 g 200 mL/hr over 30 Minutes Intravenous Every 12 hours 08/12/19 1821 08/13/19 0111   08/12/19 2000  azithromycin (ZITHROMAX) 500 mg in sodium chloride 0.9 % 250 mL IVPB     500 mg 250 mL/hr over 60 Minutes Intravenous Every 24 hours 08/12/19 1821     08/12/19 1045  cefTRIAXone (ROCEPHIN) 1 g in sodium chloride 0.9 % 100 mL IVPB  Status:  Discontinued     1 g 200 mL/hr over 30 Minutes Intravenous  Once 08/12/19 1044 08/12/19 1556      Microbiology: Results for orders placed or performed during the hospital encounter of 08/12/19  Respiratory Panel by RT PCR (Flu A&B, Covid) - Nasopharyngeal Swab     Status: None   Collection Time: 08/12/19 10:34 AM   Specimen: Nasopharyngeal Swab  Result Value Ref Range Status   SARS Coronavirus 2 by RT PCR NEGATIVE NEGATIVE Final    Comment: (NOTE) SARS-CoV-2 target nucleic acids are NOT DETECTED. The SARS-CoV-2 RNA is generally detectable in upper respiratoy specimens during the acute phase of infection. The lowest concentration of SARS-CoV-2 viral copies this assay can detect is 131 copies/mL. A negative result does not preclude SARS-Cov-2 infection and should not be used as the sole basis for treatment or other patient  management decisions. A negative result may occur with  improper specimen collection/handling, submission of specimen other than nasopharyngeal swab, presence of viral mutation(s) within the areas targeted by this assay, and inadequate number of viral copies (<131 copies/mL). A negative result must be combined with clinical observations, patient history, and epidemiological information. The expected result is Negative. Fact Sheet for Patients:  PinkCheek.be Fact Sheet for Healthcare Providers:  GravelBags.it This test is not yet ap proved or cleared by the [place] FDA and  has been authorized for detection and/or diagnosis of SARS-CoV-2 by FDA under an Emergency Use Authorization (EUA). This EUA will remain  in effect (meaning this test can be used) for the duration of the COVID-19 declaration under Section 564(b)(1) of the Act, 21 U.S.C. section 360bbb-3(b)(1), unless the authorization is terminated or revoked sooner.    Influenza A by PCR NEGATIVE NEGATIVE Final   Influenza B by PCR NEGATIVE NEGATIVE Final    Comment: (NOTE) The Xpert Xpress SARS-CoV-2/FLU/RSV assay is intended as an aid in  the diagnosis of influenza from Nasopharyngeal swab specimens and  should not be used as a sole basis for treatment. Nasal washings and  aspirates are unacceptable for Xpert Xpress SARS-CoV-2/FLU/RSV  testing. Fact Sheet for Patients: PinkCheek.be Fact Sheet for Healthcare Providers: GravelBags.it This test is not yet approved or cleared by the [place] FDA and  has been authorized for detection and/or diagnosis  of SARS-CoV-2 by  FDA under an Emergency Use Authorization (EUA). This EUA will remain  in effect (meaning this test can be used) for the duration of the  Covid-19 declaration under Section 564(b)(1) of the Act, 21  U.S.C. section 360bbb-3(b)(1), unless the  authorization is  terminated or revoked. Performed at [facility], [address] [place], [place] [address]   Culture, respiratory     Status: None (Preliminary result)   Collection Time: 08/12/19  7:10 PM   Specimen: Tracheal Aspirate  Result Value Ref Range Status   Specimen Description   Final    TRACHEAL ASPIRATE Performed at [facility], [address], [place], [place] [address]    Special Requests   Final    NONE Performed at [facility], [address] [place], [place] [address]    Gram Stain   Final    NO WBC SEEN NO ORGANISMS SEEN Performed at [facility], [place] [address], [place],  [address]    Culture PENDING  Incomplete   Report Status PENDING  Incomplete    Best Practice/Protocols:  VTE Prophylaxis: Mechanical GI Prophylaxis: Antihistamine Continous Sedation  Events: 2/02 admitted for presumed hematemesis, massive hemoptysis noted, intubated 2/03 passes SAT/SBT, no further hemoptysis noted, extubated  Studies: CT ANGIO CHEST PE W OR WO CONTRAST  Result Date: 08/12/2019 CLINICAL DATA:  Hemoptysis. EXAM: CT ANGIOGRAPHY CHEST WITH CONTRAST TECHNIQUE: Multidetector CT imaging of the chest was performed using the standard protocol during bolus administration of intravenous contrast. Multiplanar CT image reconstructions and MIPs were obtained to evaluate the vascular anatomy. CONTRAST:  28m OMNIPAQUE IOHEXOL 350 MG/ML SOLN COMPARISON:  None. FINDINGS: Cardiovascular: Satisfactory opacification of the pulmonary arteries to the segmental level. No evidence of pulmonary embolism. Normal heart size. No pericardial effusion. Mediastinum/Nodes: Endotracheal tube is in grossly good position. Aspirated material is noted in the distal trachea and bilateral bronchi. No adenopathy is noted. Thyroid gland is unremarkable. Lungs/Pleura: No pneumothorax or pleural effusion is noted. Bilateral posterior basilar opacities are noted  concerning for atelectasis or aspiration pneumonia. Left upper lobe opacity is also noted concerning for pneumonia. Upper Abdomen: No acute abnormality. Musculoskeletal: No chest wall abnormality. No acute or significant osseous findings. Review of the MIP images confirms the above findings. IMPRESSION: 1. No definite evidence of pulmonary embolus. 2. Aspirated material is noted in the distal trachea and bilateral bronchi. 3. Bilateral posterior basilar opacities are noted concerning for atelectasis or aspiration pneumonia. Left upper lobe opacity is also noted concerning for pneumonia. Electronically Signed   By: JMarijo ConceptionM.D.   On: 08/12/2019 18:40   DG Chest Port 1 View  Result Date: 08/13/2019 CLINICAL DATA:  Endotracheal tube. EXAM: PORTABLE CHEST 1 VIEW COMPARISON:  March 10, 2019. FINDINGS: The heart size and mediastinal contours are within normal limits. Endotracheal tube is seen in grossly good position. No pneumothorax or pleural effusion is noted. Both lungs are clear. The visualized skeletal structures are unremarkable. IMPRESSION: Endotracheal tube in grossly good position. No acute cardiopulmonary abnormality seen. Electronically Signed   By: JMarijo ConceptionM.D.   On: 08/13/2019 08:53   [place]IVER DOPPLER  Result Date: 08/12/2019 CLINICAL DATA:  Elevated LFTs EXAM: DUPLEX ULTRASOUND OF LIVER TECHNIQUE: Color and duplex Doppler ultrasound was performed to evaluate the hepatic in-flow and out-flow vessels. COMPARISON:  None. FINDINGS: Main Portal Vein size: 1 cm Portal Vein Velocities (all hepatopetal): Main Prox:  27 cm/sec Main Mid: 25 cm/sec Main  Dist:  22 cm/sec Right: 15 cm/sec Left: 15 cm/sec Hepatic Vein Velocities (all hepatofugal): Right:  23 cm/sec Middle:  18 cm/sec Left:  25 cm/sec IVC: Present and patent with normal respiratory phasicity. Velocity 76 cm/sec. Hepatic Artery Velocity:  55 cm/sec Splenic Vein Velocity:  14 cm/sec Spleen: 8.7 cm x 10.9 cm x 4.2 cm with a total  volume of 209 cm^3 (411 cm^3 is upper limit normal) Portal Vein Occlusion/Thrombus: No Splenic Vein Occlusion/Thrombus: No Ascites: None Varices: None IMPRESSION: 1. Unremarkable hepatic vascular Doppler evaluation. Electronically Signed   By: Lucrezia Europe M.D.   On: 08/12/2019 13:19   US ABDOMEN LIMITED RUQ  Addendum Date: 08/12/2019   ADDENDUM REPORT: 08/12/2019 17:45 ADDENDUM: These results will be called to the ordering clinician or representative by the Radiologist Assistant, and communication documented in the PACS or zVision Dashboard. Electronically Signed   By: Zetta Bills M.D.   On: 08/12/2019 17:45   Result Date: 08/12/2019 CLINICAL DATA:  Elevated liver enzymes vomiting for 1 day EXAM: ULTRASOUND ABDOMEN LIMITED RIGHT UPPER QUADRANT COMPARISON:  None. FINDINGS: Gallbladder: No gallstones or wall thickening visualized. No sonographic Murphy sign noted by sonographer. Common bile duct: Diameter: 4 mm Liver: Moderately heterogeneous hepatic echotexture, suggestion of physeal widening and nodular contour. Area of variable hepatic echogenicity measuring approximately 3.5 x 3.6 cm in the anterior right hepatic lobe (image 33 of 35. Also suggested on other images along the medial aspect of the right hemi liver, perhaps in subsegment V. on some images geographic increased echogenicity is displayed. Portal vein is patent on color Doppler imaging with normal direction of blood flow towards the liver. Other: No ascites. IMPRESSION: 1. Signs of cirrhosis with possible focal hepatic lesion versus is area of fatty infiltration. Multiphase imaging with CT or MR is suggested on follow-up. 2. No signs of cholecystitis or biliary calculi. Electronically Signed: By: Zetta Bills M.D. On: 08/12/2019 12:19    Consults: Treatment Team:  Katha Cabal, MD   Subjective:    Overnight Issues: No overnight issues.  Scant bloody secretions from ET tube.  On SAT, alert and interactive.  Follows commands.   Tolerating SBT  Objective:  Vital signs for last 24 hours: Temp:  [97.8 F (36.6 C)-98.6 F (37 C)] 98.4 F (36.9 C) (02/03 2000) Pulse Rate:  [46-100] 55 (02/03 2000) Resp:  [10-24] 11 (02/03 2000) BP: (77-144)/(51-84) 77/54 (02/03 2000) SpO2:  [100 %] 100 % (02/03 2000) FiO2 (%):  [28 %-70 %] 28 % (02/03 1128) Weight:  [53.1 kg] 53.1 kg (02/03 0500)  Hemodynamic parameters for last 24 hours:    Intake/Output from previous day: 02/02 0701 - 02/03 0700 In: 2306.8 [I.V.:1897.3; IV Piggyback:409.5] Out: 400 [Urine:350; Blood:50]  Intake/Output this shift: Total I/O In: 182.5 [I.V.:82.5; IV Piggyback:100] Out: 650 [Urine:650]  Vent settings for last 24 hours: Vent Mode: PRVC FiO2 (%):  [28 %-70 %] 28 % Set Rate:  [14 bmp] 14 bmp Vt Set:  [400 mL] 400 mL PEEP:  [5 cmH20] 5 cmH20  Physical Exam:  GENERAL: Sedated lightly, tolerating SBT, no distress HEAD: Normocephalic, atraumatic.  EYES: Pupils equal, round, reactive to light.  No scleral icterus.  MOUTH: Orotracheally intubated, OG in place. NECK: Supple. No JVD.  Trachea midline. PULMONARY: Clear lung sounds, no adventitious sounds. CARDIOVASCULAR: S1 and S2. Regular rate and rhythm. No murmurs, rubs, or gallops.  GASTROINTESTINAL: Soft, non-distended.  Positive bowel sounds.  MUSCULOSKELETAL: No joint swelling, no clubbing, no edema.  NEUROLOGIC: Lightly  sedated, follows commands. SKIN:intact,warm,dry  Assessment/Plan:  Acute hypoxic respiratory failure due to massive hemoptysis No obvious site of bleeding on bronchoscopy Potential pneumonia on CT scan TB being ruled out Cannot exclude AVM, DAH She passes SAT and SBT Plan extubation today Continue antibiotics, continue steroids Continue pulmonary toilet  Modest thrombocytopenia Note that patient has cirrhosis Will have hematology assist with work-up Doubt cause of hemoptysis but clearly did not help issue  Diabetes mellitus ICU hyperglycemia  protocol  Prophylaxis: Famotidine, SCDs avoid chemical VTE prophylaxis due to massive hemoptysis    LOS: 1 day   Additional comments:Discussed during multidisciplinary rounds  Critical Care Total Time*: 35 Minutes  C. Derrill Kay, MD [facility] 08/13/2019  *Care during the described time interval was provided by me and/or other providers on the critical care team.  I have reviewed this patient's available data, including medical history, events of note, physical examination and test results as part of my evaluation.  **This note was dictated using voice recognition software/Dragon.  Despite best efforts to proofread, errors can occur which can change the meaning.  Any change was purely unintentional.

## 2019-08-13 NOTE — Progress Notes (Signed)
During physician rounds it was established patient was ready to prep for extubation.  I have discontinued propofol but continued the Fentanyl therapy.  Patient blood pressure remains soft with maps above 65.  HR bradycardic at 57 BPM.  Spoke with patient fmaily about extubation procedure and the purpose of this progression in therapy.  Monitoring patient GCS.  Currently -1.

## 2019-08-13 NOTE — Anesthesia Postprocedure Evaluation (Signed)
Anesthesia Post Note  Patient: Vickie Smith  Procedure(s) Performed: ESOPHAGOGASTRODUODENOSCOPY (EGD) (N/A )  Patient location during evaluation: SICU Anesthesia Type: General Level of consciousness: sedated Pain management: pain level controlled Vital Signs Assessment: post-procedure vital signs reviewed and stable Respiratory status: patient remains intubated per anesthesia plan Cardiovascular status: stable Postop Assessment: no apparent nausea or vomiting Anesthetic complications: no     Last Vitals:  Vitals:   08/13/19 0700 08/13/19 0718  BP: (!) 80/55   Pulse: (!) 49   Resp: 14   Temp:    SpO2: 100% 100%    Last Pain:  Vitals:   08/13/19 0400  TempSrc: Axillary  PainSc:                  Vickie Smith

## 2019-08-14 ENCOUNTER — Other Ambulatory Visit (INDEPENDENT_AMBULATORY_CARE_PROVIDER_SITE_OTHER): Payer: Self-pay | Admitting: Vascular Surgery

## 2019-08-14 ENCOUNTER — Encounter: Payer: Self-pay | Admitting: Internal Medicine

## 2019-08-14 DIAGNOSIS — D696 Thrombocytopenia, unspecified: Secondary | ICD-10-CM

## 2019-08-14 LAB — CBC
HCT: 36.5 % (ref 36.0–46.0)
Hemoglobin: 12.4 g/dL (ref 12.0–15.0)
MCH: 31.5 pg (ref 26.0–34.0)
MCHC: 34 g/dL (ref 30.0–36.0)
MCV: 92.6 fL (ref 80.0–100.0)
Platelets: 100 10*3/uL — ABNORMAL LOW (ref 150–400)
RBC: 3.94 MIL/uL (ref 3.87–5.11)
RDW: 12.6 % (ref 11.5–15.5)
WBC: 10.8 10*3/uL — ABNORMAL HIGH (ref 4.0–10.5)
nRBC: 0 % (ref 0.0–0.2)

## 2019-08-14 LAB — IRON AND TIBC
Iron: 77 ug/dL (ref 28–170)
Saturation Ratios: 30 % (ref 10.4–31.8)
TIBC: 253 ug/dL (ref 250–450)
UIBC: 176 ug/dL

## 2019-08-14 LAB — RENAL FUNCTION PANEL
Albumin: 3.2 g/dL — ABNORMAL LOW (ref 3.5–5.0)
Anion gap: 8 (ref 5–15)
BUN: 13 mg/dL (ref 6–20)
CO2: 24 mmol/L (ref 22–32)
Calcium: 8.5 mg/dL — ABNORMAL LOW (ref 8.9–10.3)
Chloride: 107 mmol/L (ref 98–111)
Creatinine, Ser: 0.32 mg/dL — ABNORMAL LOW (ref 0.44–1.00)
GFR calc Af Amer: >60 mL/min (ref >60)
GFR calc non Af Amer: >60 mL/min (ref >60)
Glucose, Bld: 207 mg/dL — ABNORMAL HIGH (ref 70–99)
Phosphorus: 3.2 mg/dL (ref 2.5–4.6)
Potassium: 3.9 mmol/L (ref 3.5–5.1)
Sodium: 139 mmol/L (ref 135–145)

## 2019-08-14 LAB — GLUCOSE, CAPILLARY
Glucose-Capillary: 215 mg/dL — ABNORMAL HIGH (ref 70–99)
Glucose-Capillary: 201 mg/dL — ABNORMAL HIGH (ref 70–99)
Glucose-Capillary: 135 mg/dL — ABNORMAL HIGH (ref 70–99)
Glucose-Capillary: 174 mg/dL — ABNORMAL HIGH (ref 70–99)
Glucose-Capillary: 200 mg/dL — ABNORMAL HIGH (ref 70–99)
Glucose-Capillary: 170 mg/dL — ABNORMAL HIGH (ref 70–99)
Glucose-Capillary: 118 mg/dL — ABNORMAL HIGH (ref 70–99)

## 2019-08-14 LAB — ACID FAST SMEAR (AFB, MYCOBACTERIA): Acid Fast Smear: NEGATIVE

## 2019-08-14 LAB — MAGNESIUM: Magnesium: 2.2 mg/dL (ref 1.7–2.4)

## 2019-08-14 LAB — C-REACTIVE PROTEIN: CRP: 1.3 mg/dL — ABNORMAL HIGH (ref <1.0)

## 2019-08-14 LAB — H PYLORI, IGM, IGG, IGA AB
H Pylori IgG: 4.19 Index Value — ABNORMAL HIGH (ref 0.00–0.79)
H. Pylogi, Iga Abs: >35 units — ABNORMAL HIGH (ref 0.0–8.9)
H. Pylogi, Igm Abs: 12.5 units — ABNORMAL HIGH (ref 0.0–8.9)

## 2019-08-14 LAB — TRIGLYCERIDES: Triglycerides: 118 mg/dL (ref <150)

## 2019-08-14 LAB — FERRITIN: Ferritin: 178 ng/mL (ref 11–307)

## 2019-08-14 MED ORDER — ONDANSETRON HCL 4 MG/2ML IJ SOLN: 4.0000 mg | Freq: Four times a day (QID) | INTRAMUSCULAR | Status: DC | PRN

## 2019-08-14 MED ORDER — CEFAZOLIN SODIUM-DEXTROSE 2-4 GM/100ML-% IV SOLN
INTRAVENOUS | Status: AC
  Administered 2019-08-15: 10:00:00 2 g via INTRAVENOUS
  Filled 2019-08-14: qty 100

## 2019-08-14 MED ORDER — METHYLPREDNISOLONE SODIUM SUCC 125 MG IJ SOLR: 125.0000 mg | Freq: Once | INTRAMUSCULAR | Status: DC | PRN

## 2019-08-14 MED ORDER — FAMOTIDINE 20 MG PO TABS: 40.0000 mg | ORAL_TABLET | Freq: Once | ORAL | Status: DC | PRN

## 2019-08-14 MED ORDER — BLISTEX MEDICATED EX OINT
TOPICAL_OINTMENT | CUTANEOUS | Status: DC | PRN
  Filled 2019-08-14: qty 6.3

## 2019-08-14 MED ORDER — PRO-STAT SUGAR FREE PO LIQD
30.0000 mL | Freq: Three times a day (TID) | ORAL | Status: DC
  Administered 2019-08-15 – 2019-08-17 (×2): 30 mL via ORAL

## 2019-08-14 MED ORDER — CEFAZOLIN SODIUM-DEXTROSE 2-4 GM/100ML-% IV SOLN
2.0000 g | Freq: Once | INTRAVENOUS | Status: DC
  Filled 2019-08-14: qty 100

## 2019-08-14 MED ORDER — HYDROMORPHONE HCL 1 MG/ML IJ SOLN: 1.0000 mg | Freq: Once | INTRAMUSCULAR | Status: DC | PRN

## 2019-08-14 MED ORDER — SODIUM CHLORIDE 0.9 % IV SOLN: INTRAVENOUS | Status: DC

## 2019-08-14 MED ORDER — DIPHENHYDRAMINE HCL 50 MG/ML IJ SOLN: 50.0000 mg | Freq: Once | INTRAMUSCULAR | Status: DC | PRN

## 2019-08-14 MED ORDER — MIDAZOLAM HCL 2 MG/ML PO SYRP
8.0000 mg | ORAL_SOLUTION | Freq: Once | ORAL | Status: DC | PRN
  Filled 2019-08-14: qty 4

## 2019-08-14 MED ORDER — LIP MEDEX EX OINT
TOPICAL_OINTMENT | CUTANEOUS | Status: DC | PRN
  Filled 2019-08-14: qty 7

## 2019-08-14 NOTE — Consult Note (Signed)
PHARMACY CONSULT NOTE - FOLLOW UP  Pharmacy Consult for Electrolyte Monitoring and Replacement   Vickie Smith is a 54 y.o. female who was admitted on 08/12/2019 with hematemesis and abdominal pain. PMH includes diabetes mellitus. Per am rounds, pt is extubated and coughed/vomited blood clots this morning. Pt has started feeding supplements today. Fentanyl continuous IV has been D/C'ed today. Pharmacy is consulted to assist with monitoring and replacing electrolytes.   Recent Labs: Potassium (mmol/L)  Date Value  08/14/2019 3.9   Magnesium (mg/dL)  Date Value  08/14/2019 2.2   Calcium (mg/dL)  Date Value  08/14/2019 8.5 (L)   Albumin (g/dL)  Date Value  08/14/2019 3.2 (L)   Phosphorus (mg/dL)  Date Value  08/14/2019 3.2   Sodium (mmol/L)  Date Value  08/14/2019 139     Assessment: 1. Electrolytes: Electrolytes WNL. Corrected calcium is 9.1 (WNL). No replacements needed at this time. Will continue to monitor electrolytes in am labs. Will replace to achieve goals for potassium ~4 and magnesium ~2.   2. Glucose: Today's range: 135-215. Glucose levels are trending similiarly as yesterday. Pt is on insulin aspart 0-15 units [place] Q4H (received 5 units today so far) along with methylprednisolone 65m IV Q12H (duration so far: 3 days). Continue insulin administrations and monitor glucose levels daily.   3. Constipation: Last BM: 02/01. Pt is on Senokot per tube BID PRN and no longer on continuous fentanyl IV. Continue to monitor   SRoanna Banning,PharmD Candidate 08/14/2019 2:49 PM

## 2019-08-14 NOTE — Progress Notes (Addendum)
Follow up - Critical Care Medicine Note  Patient Details:    Vickie Smith is an 54 y.o. female lifelong never smoker, native of [place], presented for presumed hematemesis later found out to be massive hemoptysis (approximately 250 mL) patient was intubated and mechanically ventilated.  Had bronchoscopy 2/2.  Lines, Airways, Drains:    Anti-infectives:  Anti-infectives (From admission, onward)   Start     Dose/Rate Route Frequency Ordered Stop   08/15/19 0600  ceFAZolin (ANCEF) IVPB 2g/100 mL premix    Note to Pharmacy: To be given in specials   2 g 200 mL/hr over 30 Minutes Intravenous  Once 08/14/19 1639     08/14/19 1643  ceFAZolin (ANCEF) 2-4 GM/100ML-% IVPB    Note to Pharmacy: Corlis Hove   : cabinet override      08/14/19 1643 08/15/19 0459   08/13/19 1800  cefTRIAXone (ROCEPHIN) 2 g in sodium chloride 0.9 % 100 mL IVPB     2 g 200 mL/hr over 30 Minutes Intravenous Every 24 hours 08/13/19 0111 08/17/19 1759   08/12/19 2200  cefTRIAXone (ROCEPHIN) 2 g in sodium chloride 0.9 % 100 mL IVPB  Status:  Discontinued     2 g 200 mL/hr over 30 Minutes Intravenous Every 12 hours 08/12/19 1821 08/13/19 0111   08/12/19 2000  azithromycin (ZITHROMAX) 500 mg in sodium chloride 0.9 % 250 mL IVPB     500 mg 250 mL/hr over 60 Minutes Intravenous Every 24 hours 08/12/19 1821 08/17/19 1759   08/12/19 1045  cefTRIAXone (ROCEPHIN) 1 g in sodium chloride 0.9 % 100 mL IVPB  Status:  Discontinued     1 g 200 mL/hr over 30 Minutes Intravenous  Once 08/12/19 1044 08/12/19 1556      Microbiology: Results for orders placed or performed during the hospital encounter of 08/12/19  Respiratory Panel by RT PCR (Flu A&B, Covid) - Nasopharyngeal Swab     Status: None   Collection Time: 08/12/19 10:34 AM   Specimen: Nasopharyngeal Swab  Result Value Ref Range Status   SARS Coronavirus 2 by RT PCR NEGATIVE NEGATIVE Final    Comment: (NOTE) SARS-CoV-2 target nucleic acids are NOT DETECTED. The  SARS-CoV-2 RNA is generally detectable in upper respiratoy specimens during the acute phase of infection. The lowest concentration of SARS-CoV-2 viral copies this assay can detect is 131 copies/mL. A negative result does not preclude SARS-Cov-2 infection and should not be used as the sole basis for treatment or other patient management decisions. A negative result may occur with  improper specimen collection/handling, submission of specimen other than nasopharyngeal swab, presence of viral mutation(s) within the areas targeted by this assay, and inadequate number of viral copies (<131 copies/mL). A negative result must be combined with clinical observations, patient history, and epidemiological information. The expected result is Negative. Fact Sheet for Patients:  PinkCheek.be Fact Sheet for Healthcare Providers:  GravelBags.it This test is not yet ap proved or cleared by the [place] FDA and  has been authorized for detection and/or diagnosis of SARS-CoV-2 by FDA under an Emergency Use Authorization (EUA). This EUA will remain  in effect (meaning this test can be used) for the duration of the COVID-19 declaration under Section 564(b)(1) of the Act, 21 U.S.C. section 360bbb-3(b)(1), unless the authorization is terminated or revoked sooner.    Influenza A by PCR NEGATIVE NEGATIVE Final   Influenza B by PCR NEGATIVE NEGATIVE Final    Comment: (NOTE) The Xpert Xpress SARS-CoV-2/FLU/RSV assay is intended as  an aid in  the diagnosis of influenza from Nasopharyngeal swab specimens and  should not be used as a sole basis for treatment. Nasal washings and  aspirates are unacceptable for Xpert Xpress SARS-CoV-2/FLU/RSV  testing. Fact Sheet for Patients: PinkCheek.be Fact Sheet for Healthcare Providers: GravelBags.it This test is not yet approved or cleared by the [place] FDA and  has been authorized for detection and/or diagnosis of SARS-CoV-2 by  FDA under an Emergency Use Authorization (EUA). This EUA will remain  in effect (meaning this test can be used) for the duration of the  Covid-19 declaration under Section 564(b)(1) of the Act, 21  U.S.C. section 360bbb-3(b)(1), unless the authorization is  terminated or revoked. Performed at [facility], [place], [place] [address]   Acid Fast Smear (AFB)     Status: None   Collection Time: 08/12/19  7:10 PM   Specimen: Bronchial Alveolar Lavage; Sputum  Result Value Ref Range Status   AFB Specimen Processing Concentration  Final   Acid Fast Smear Negative  Final    Comment: (NOTE) Performed At: [facility] [address] [place], [place] [id number] Rush Farmer MD NA:[id number]    Source (AFB) BRONCHIAL WASHINGS  Final    Comment: Performed at [facility], [address] [place], [place] [address]  Culture, respiratory     Status: None (Preliminary result)   Collection Time: 08/12/19  7:10 PM   Specimen: Tracheal Aspirate  Result Value Ref Range Status   Specimen Description   Final    TRACHEAL ASPIRATE Performed at [facility], [address], [place], [place] [address]    Special Requests   Final    NONE Performed at [facility] [place], [place] [address]    Gram Stain NO WBC SEEN NO ORGANISMS SEEN   Final   Culture   Final    NO GROWTH 1 DAY Performed at [facility], [place] [address] [place], [place] [address]    Report Status PENDING  Incomplete    Best Practice/Protocols:  VTE Prophylaxis: Mechanical GI Prophylaxis: Antihistamine   Events: 2/02 admitted for presumed hematemesis, massive hemoptysis noted, intubated 2/03 passes SAT/SBT, no further hemoptysis noted, extubated 2/04 extubated yesterday, no sequela  Studies: CT ANGIO CHEST PE W OR WO CONTRAST  Result Date:  08/12/2019 CLINICAL DATA:  Hemoptysis. EXAM: CT ANGIOGRAPHY CHEST WITH CONTRAST TECHNIQUE: Multidetector CT imaging of the chest was performed using the standard protocol during bolus administration of intravenous contrast. Multiplanar CT image reconstructions and MIPs were obtained to evaluate the vascular anatomy. CONTRAST:  62m OMNIPAQUE IOHEXOL 350 MG/ML SOLN COMPARISON:  None. FINDINGS: Cardiovascular: Satisfactory opacification of the pulmonary arteries to the segmental level. No evidence of pulmonary embolism. Normal heart size. No pericardial effusion. Mediastinum/Nodes: Endotracheal tube is in grossly good position. Aspirated material is noted in the distal trachea and bilateral bronchi. No adenopathy is noted. Thyroid gland is unremarkable. Lungs/Pleura: No pneumothorax or pleural effusion is noted. Bilateral posterior basilar opacities are noted concerning for atelectasis or aspiration pneumonia. Left upper lobe opacity is also noted concerning for pneumonia. Upper Abdomen: No acute abnormality. Musculoskeletal: No chest wall abnormality. No acute or significant osseous findings. Review of the MIP images confirms the above findings. IMPRESSION: 1. No definite evidence of pulmonary embolus. 2. Aspirated material is noted in the distal trachea and bilateral bronchi. 3. Bilateral posterior basilar opacities are noted concerning for atelectasis or aspiration pneumonia. Left upper lobe  opacity is also noted concerning for pneumonia. Electronically Signed   By: Marijo Conception M.D.   On: 08/12/2019 18:40   DG Chest Port 1 View  Result Date: 08/13/2019 CLINICAL DATA:  Endotracheal tube. EXAM: PORTABLE CHEST 1 VIEW COMPARISON:  March 10, 2019. FINDINGS: The heart size and mediastinal contours are within normal limits. Endotracheal tube is seen in grossly good position. No pneumothorax or pleural effusion is noted. Both lungs are clear. The visualized skeletal structures are unremarkable. IMPRESSION:  Endotracheal tube in grossly good position. No acute cardiopulmonary abnormality seen. Electronically Signed   By: Marijo Conception M.D.   On: 08/13/2019 08:53   US LIVER DOPPLER  Result Date: 08/12/2019 CLINICAL DATA:  Elevated LFTs EXAM: DUPLEX ULTRASOUND OF LIVER TECHNIQUE: Color and duplex Doppler ultrasound was performed to evaluate the hepatic in-flow and out-flow vessels. COMPARISON:  None. FINDINGS: Main Portal Vein size: 1 cm Portal Vein Velocities (all hepatopetal): Main Prox:  27 cm/sec Main Mid: 25 cm/sec Main Dist:  22 cm/sec Right: 15 cm/sec Left: 15 cm/sec Hepatic Vein Velocities (all hepatofugal): Right:  23 cm/sec Middle:  18 cm/sec Left:  25 cm/sec IVC: Present and patent with normal respiratory phasicity. Velocity 76 cm/sec. Hepatic Artery Velocity:  55 cm/sec Splenic Vein Velocity:  14 cm/sec Spleen: 8.7 cm x 10.9 cm x 4.2 cm with a total volume of 209 cm^3 (411 cm^3 is upper limit normal) Portal Vein Occlusion/Thrombus: No Splenic Vein Occlusion/Thrombus: No Ascites: None Varices: None IMPRESSION: 1. Unremarkable hepatic vascular Doppler evaluation. Electronically Signed   By: Lucrezia Europe M.D.   On: 08/12/2019 13:19   US ABDOMEN LIMITED RUQ  Addendum Date: 08/12/2019   ADDENDUM REPORT: 08/12/2019 17:45 ADDENDUM: These results will be called to the ordering clinician or representative by the Radiologist Assistant, and communication documented in the PACS or zVision Dashboard. Electronically Signed   By: Zetta Bills M.D.   On: 08/12/2019 17:45   Result Date: 08/12/2019 CLINICAL DATA:  Elevated liver enzymes vomiting for 1 day EXAM: ULTRASOUND ABDOMEN LIMITED RIGHT UPPER QUADRANT COMPARISON:  None. FINDINGS: Gallbladder: No gallstones or wall thickening visualized. No sonographic Murphy sign noted by sonographer. Common bile duct: Diameter: 4 mm Liver: Moderately heterogeneous hepatic echotexture, suggestion of physeal widening and nodular contour. Area of variable hepatic echogenicity  measuring approximately 3.5 x 3.6 cm in the anterior right hepatic lobe (image 33 of 35. Also suggested on other images along the medial aspect of the right hemi liver, perhaps in subsegment V. on some images geographic increased echogenicity is displayed. Portal vein is patent on color Doppler imaging with normal direction of blood flow towards the liver. Other: No ascites. IMPRESSION: 1. Signs of cirrhosis with possible focal hepatic lesion versus is area of fatty infiltration. Multiphase imaging with CT or MR is suggested on follow-up. 2. No signs of cholecystitis or biliary calculi. Electronically Signed: By: Zetta Bills M.D. On: 08/12/2019 12:19    Consults: Treatment Team:  Katha Cabal, MD   Subjective:    Overnight Issues: No overnight issues.  Coughed up a few clots this morning.  No further massive hemoptysis.  Complains of sore throat.  Objective:  Vital signs for last 24 hours: Temp:  [98 F (36.7 C)-98.4 F (36.9 C)] 98 F (36.7 C) (02/04 1200) Pulse Rate:  [54-76] 71 (02/04 1400) Resp:  [11-22] 13 (02/04 1400) BP: (77-105)/(54-73) 105/73 (02/04 1400) SpO2:  [97 %-100 %] 97 % (02/04 1400) Weight:  [56.6 kg] 56.6 kg (02/04  0500)  Hemodynamic parameters for last 24 hours:    Intake/Output from previous day: 02/03 0701 - 02/04 0700 In: 2527.2 [P.O.:240; I.V.:1837.2; IV Piggyback:450] Out: 2150 [Urine:2150]  Intake/Output this shift: Total I/O In: 969.8 [P.O.:270; I.V.:649.8; IV Piggyback:50] Out: -   Vent settings for last 24 hours:    Physical Exam:  GENERAL: Awake alert, no distress.   HEAD: Normocephalic, atraumatic.  EYES: Pupils equal, round, reactive to light.  No scleral icterus.  MOUTH: Oral mucosa moist, no thrush. NECK: Supple. No JVD.  Trachea midline. PULMONARY: Clear lung sounds, no adventitious sounds. CARDIOVASCULAR: S1 and S2. Regular rate and rhythm. No murmurs, rubs, or gallops.  GASTROINTESTINAL: Soft, non-distended.    MUSCULOSKELETAL: No joint swelling, no clubbing, no edema.  NEUROLOGIC: Lightly sedated, follows commands. SKIN:intact,warm,dry  Assessment/Plan:  Acute hypoxic respiratory failure due to massive hemoptysis No obvious site of bleeding on bronchoscopy Potential pneumonia on CT scan BAL AFB negative Cannot exclude PAVM/HHT, DAH If recurs with fresh bleeding will need pulmonary angiogram with potential bronchial artery embolization Continue antibiotics, continue steroids Continue pulmonary toilet   Modest thrombocytopenia Note that patient has cirrhosis Will have hematology assist with work-up Doubt cause of hemoptysis but clearly did not help issue  Hepatic cirrhosis Abnormal LFTs No history of alcohol consumption No familial history per patient Connective tissue disease work-up See orders for details May need GI weigh in again In the presence of cirrhosis, PAVM may be a consideration, monitor  Diabetes mellitus ICU hyperglycemia protocol  Prophylaxis: Famotidine, SCDs avoid chemical VTE prophylaxis due to massive hemoptysis    LOS: 2 days   Additional comments:Discussed during multidisciplinary rounds  Critical Care Total Time*:  IF remains stable may transfer to [place] floor  C. Derrill Kay, MD [facility] 08/14/2019  *Care during the described time interval was provided by me and/or other providers on the critical care team.  I have reviewed this patient's available data, including medical history, events of note, physical examination and test results as part of my evaluation.  **This note was dictated using voice recognition software/Dragon.  Despite best efforts to proofread, errors can occur which can change the meaning.  Any change was purely unintentional.

## 2019-08-14 NOTE — Progress Notes (Signed)
Ch visited wt Pt in response to a page; request by patient to assign Medical PoA before possible procedure. Interpreter Herb Grays, and nurse Haven went along with Ch to help provide education on AD, and inability to complete one because of visitor policy at this time. Pt desires to designate her close friend as her POA, so she can take care of Pt's daughter mainly.Ch and interpreter successfully completed a "Goals of Care" document to stand in place of AD until a formal meeting can be convened, to complete AD with all parties, witnesses and notary. Pt was very emotional and concerned about future. Ch prayed with Pt, with help of interpreter until one of the MDs came to explain embolization procedure to Pt.   08/14/19 1620  Clinical Encounter Type  Visited With Patient;Health care provider  Visit Type Initial;Psychological support;Spiritual support;Social support  Referral From Patient;Nurse  Consult/Referral To Chaplain  Spiritual Encounters  Spiritual Needs Prayer;Emotional  Stress Factors  Patient Stress Factors Health changes;Loss of control;Major life changes  Advance Directives (For Healthcare)  Does Patient Have a Medical Advance Directive? No  Would patient like information on creating a medical advance directive? Yes (Inpatient - patient requests chaplain consult to create a medical advance directive)

## 2019-08-14 NOTE — Progress Notes (Addendum)
2023: Pt reported she felt nauseous then began to cough/vomit blood clots. Respiratory status stable. Zofran given. MD made aware  1330: Called dietary again as Pt in for tray. Michela Pitcher will be up soon. Spoke extensively with Pt sister with assistance from in house interpreter. All questions answered. Pt phone and glasses given to Pt per Pt and sister request.   1530: Pt vomiting up copious frank blood and multiple blood clots. Dr Mortimer Fries made aware  1600: Pt would like to designate her friend as her POA. Unable to have visitors at this time, Chaplain and interpreter visited to designate goals of care until a formal meeting can be made to finalize POA with notary. Pt very scared, emotional support given and prayer shared between all and appreciated by Pt.   1800: Unable to perform procedure, Pt back in room and placed back on bedside monitor. Explained to Pt she has to remain NPO for now per surgical team in case they taker her back to [facility].

## 2019-08-14 NOTE — Progress Notes (Signed)
While getting the patient prepped for her procedure we were notified of an emergent patient in the ER.That patient needed emergency surgery in the OR and we were instructed to return the patient to her room for possible procedure later tonight or tomorrow in the AM.

## 2019-08-14 NOTE — Assessment & Plan Note (Addendum)
54 year old female patient is currently admitted to hospital for hemoptysis/noted to have bilateral pneumonia with thrombocytopenia.  #Thrombocytopenia-moderate 100-1 10-etiology is unclear.  Most likely benign causes like-liver disease cirrhosis versus ITP.  Do not think mild thrombocytopenia because of patient's hemoptysis.  Limited right upper quadrant abdominal ultrasound shows-cirrhosis-multifocal area 3.5 centimeter fatty sparing versus lesion.  Recommend AFP; also recommend MRI of the liver with and without contrast when patient is clinically stable.  Will check AFP.  #Acute respiratory failure secondary to -bilateral pneumonia/hemoptysis-as per ICU team.  Currently s/p extubation.   #Cirrhosis-question etiology s/p EGD.  Work-up in progress; followed by GI.  Thank you Dr. Patsey Berthold for allowing me to participate in the care of your pleasant patient. Please do not hesitate to contact me with questions or concerns in the interim.

## 2019-08-14 NOTE — Progress Notes (Signed)
Received call from vascular nurse orders from MD saying pt can have clear liquids, but keep NPO after midnight.  She will be having her procedure in the morning.  Will continue to monitor pt.

## 2019-08-14 NOTE — Consult Note (Signed)
[facility] NOTE  Patient Care Team: [facility], [place] as PCP - General  CHIEF COMPLAINTS/PURPOSE OF CONSULTATION:  Thrombocytopenia  HISTORY OF PRESENTING ILLNESS:  Vickie Smith 54 y.o.  female Spanish-speaking female patient with longstanding history of mild to moderate thrombocytopenia, poorly controlled diabetes-has been admitted to hospital for hematemesis.  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  Patient subsequently developed hemoptysis and acute respiratory failure-needing intubation; and ICU care.  Patient extubated yesterday on February 3rd.  Patient feels quite tired.  Patient denies any significant bleeding hemoptysis or hematemesis at this time.   Patient feels tired; sleepy.   Review of Systems  Unable to perform ROS: Critical illness     MEDICAL HISTORY:  Past Medical History:  Diagnosis Date  . Diabetes mellitus without complication ([place])     SURGICAL HISTORY: Past Surgical History:  Procedure Laterality Date  . ESOPHAGOGASTRODUODENOSCOPY N/A 08/12/2019   Procedure: ESOPHAGOGASTRODUODENOSCOPY (EGD);  Surgeon: Lin Landsman, MD;  Location[facility] ENDOSCOPY;  Service: Gastroenterology;  Laterality: N/A;    SOCIAL HISTORY: Social History   Socioeconomic History  . Marital status: Married    Spouse name: Not on file  . Number of children: Not on file  . Years of education: Not on file  . Highest education level: Not on file  Occupational History  . Not on file  Tobacco Use  . Smoking status: Never Smoker  . Smokeless tobacco: Never Used  Substance and Sexual Activity  . Alcohol use: Never  . Drug use: Never  . Sexual activity: Not on file  Other Topics Concern  . 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 Minimal english   Social Determinants of  Health   Financial Resource Strain:   . Difficulty of Paying Living Expenses: Not on file  Food Insecurity:   . Worried About Charity fundraiser in the Last Year: Not on file  . Ran Out of Food in the Last Year: Not on file  Transportation Needs:   . Lack of Transportation (Medical): Not on file  . Lack of Transportation (Non-Medical): Not on file  Physical Activity:   . Days of Exercise per Week: Not on file  . Minutes of Exercise per Session: Not on file  Stress:   . Feeling of Stress : Not on file  Social Connections:   . Frequency of Communication with Friends and Family: Not on file  . Frequency of Social Gatherings with Friends and Family: Not on file  . Attends Religious Services: Not on file  . Active Member of Clubs or Organizations: Not on file  . Attends Archivist Meetings: Not on file  . Marital Status: Not on file  Intimate Partner Violence:   . Fear of Current or Ex-Partner: Not on file  . Emotionally Abused: Not on file  . Physically Abused: Not on file  . Sexually Abused: Not on file    FAMILY HISTORY: History reviewed. No pertinent family history.  ALLERGIES:  has No Known Allergies.  MEDICATIONS:  Current Facility-Administered Medications  Medication Dose Route Frequency Provider Last Rate Last Admin  . 0.9 %  sodium chloride infusion   Intravenous Continuous Ivor Costa, MD 100 mL/hr at 08/14/19 1723 Rate Verify at 08/14/19 1723  . 0.9 %  sodium chloride infusion  250 mL Intravenous PRN Kasa, Kurian,  MD   Stopped at 08/13/19 1255  . acetaminophen (TYLENOL) tablet 650 mg  650 mg Oral Q4H PRN Flora Lipps, MD      . azithromycin (ZITHROMAX) 500 mg in sodium chloride 0.9 % 250 mL IVPB  500 mg Intravenous Q24H Flora Lipps, MD   Stopped at 08/13/19 1837  . ceFAZolin (ANCEF) 2-4 GM/100ML-% IVPB           . cefTRIAXone (ROCEPHIN) 2 g in sodium chloride 0.9 % 100 mL IVPB  2 g Intravenous Q24H Awilda Bill, NP   Stopped at 08/13/19 1910  .  Chlorhexidine Gluconate Cloth 2 % PADS 6 each  6 each Topical Daily Flora Lipps, MD   6 each at 08/14/19 1227  . famotidine (PEPCID) IVPB 20 mg premix  20 mg Intravenous Q12H Flora Lipps, MD   Stopped at 08/14/19 [phone]  . feeding supplement (PRO-STAT SUGAR FREE 64) liquid 30 mL  30 mL Oral TID BM Tyler Pita, MD   Stopped at 08/14/19 1555  . fentaNYL (SUBLIMAZE) bolus via infusion 50-100 mcg  50-100 mcg Intravenous Q2H PRN Awilda Bill, NP      . HYDROmorphone (DILAUDID) injection 1 mg  1 mg Intravenous Once PRN Stegmayer, Kimberly A, PA-C      . insulin aspart (novoLOG) injection 0-15 Units  0-15 Units Subcutaneous Q4H Dallie Piles, RPH   3 Units at 08/14/19 1616  . lip balm (BLISTEX) ointment   Topical PRN Tyler Pita, MD      . methylPREDNISolone sodium succinate (SOLU-MEDROL) 40 mg/mL injection 40 mg  40 mg Intravenous Q12H Flora Lipps, MD   40 mg at 08/14/19 2014  . midazolam (VERSED) injection 2 mg  2 mg Intravenous Q15 min PRN Flora Lipps, MD   2 mg at 08/12/19 1825  . midazolam (VERSED) injection 2 mg  2 mg Intravenous Q2H PRN Flora Lipps, MD      . ondansetron (ZOFRAN) injection 4 mg  4 mg Intravenous Q6H PRN Flora Lipps, MD   4 mg at 08/14/19 1954  . sennosides (SENOKOT) 8.8 MG/5ML syrup 5 mL  5 mL Per Tube BID PRN Flora Lipps, MD      . sodium chloride flush (NS) 0.9 % injection 3 mL  3 mL Intravenous Q12H Flora Lipps, MD   3 mL at 08/13/19 2224  . sodium chloride flush (NS) 0.9 % injection 3 mL  3 mL Intravenous PRN Flora Lipps, MD      . vecuronium (NORCURON) injection 10 mg  10 mg Intravenous Q1H PRN Flora Lipps, MD   10 mg at 08/12/19 1854      .  PHYSICAL EXAMINATION:  Vitals:   08/14/19 1734 08/14/19 1900  BP: 98/67 114/71  Pulse: 68 (!) 56  Resp: 12 11  Temp: 98.4 F (36.9 C)   SpO2: 100% 99%   Filed Weights   08/13/19 0500 08/14/19 0500 08/14/19 1734  Weight: 117 lb 1 oz (53.1 kg) 124 lb 12.5 oz (56.6 kg) 124 lb 12.5 oz (56.6 kg)     Physical Exam  Constitutional: She is oriented to person, place, and time and well-developed, well-nourished, and in no distress.  HENT:  Head: Normocephalic and atraumatic.  Mouth/Throat: Oropharynx is clear and moist. No oropharyngeal exudate.  Eyes: Pupils are equal, round, and reactive to light.  Cardiovascular: Normal rate and regular rhythm.  Pulmonary/Chest: No respiratory distress. She has no wheezes.  Bilateral coarse breath sounds at the bases.  Abdominal: Soft. Bowel  sounds are normal. She exhibits no distension and no mass. There is no abdominal tenderness. There is no rebound and no guarding.  Musculoskeletal:        General: No tenderness or edema. Normal range of motion.     Cervical back: Normal range of motion and neck supple.  Neurological: She is alert and oriented to person, place, and time.  Skin: Skin is warm.  Psychiatric: Affect normal.     LABORATORY DATA:  I have reviewed the data as listed Lab Results  Component Value Date   WBC 10.8 (H) 08/14/2019   HGB 12.4 08/14/2019   HCT 36.5 08/14/2019   MCV 92.6 08/14/2019   PLT 100 (L) 08/14/2019   Recent Labs    03/10/19 1431 07/29/19 1139 08/12/19 0820 08/13/19 0509 08/14/19 0456  NA 134*   < > 135 139 139  K 3.6   < > 3.9 3.9 3.9  CL 101   < > 103 107 107  CO2 23   < > 20* 22 24  GLUCOSE 345*   < > 317* 301* 207*  BUN 16   < > 16 18 13   CREATININE 0.55   < > 0.48 0.35* 0.32*  CALCIUM 9.6   < > 9.5 8.6* 8.5*  GFRNONAA >60   < > >60 >60 >60  GFRAA >60   < > >60 >60 >60  PROT 8.7*  --  8.4* 7.5  --   ALBUMIN 3.8  --  4.3 3.6 3.2*  AST 77*  --  79* 86*  --   ALT 78*  --  109* 95*  --   ALKPHOS 199*  --  174* 110  --   BILITOT 0.6  --  0.7 0.6  --    < > = values in this interval not displayed.    RADIOGRAPHIC STUDIES: I have personally reviewed the radiological images as listed and agreed with the findings in the report. CT ANGIO CHEST PE W OR WO CONTRAST  Result Date:  08/12/2019 CLINICAL DATA:  Hemoptysis. EXAM: CT ANGIOGRAPHY CHEST WITH CONTRAST TECHNIQUE: Multidetector CT imaging of the chest was performed using the standard protocol during bolus administration of intravenous contrast. Multiplanar CT image reconstructions and MIPs were obtained to evaluate the vascular anatomy. CONTRAST:  69m OMNIPAQUE IOHEXOL 350 MG/ML SOLN COMPARISON:  None. FINDINGS: Cardiovascular: Satisfactory opacification of the pulmonary arteries to the segmental level. No evidence of pulmonary embolism. Normal heart size. No pericardial effusion. Mediastinum/Nodes: Endotracheal tube is in grossly good position. Aspirated material is noted in the distal trachea and bilateral bronchi. No adenopathy is noted. Thyroid gland is unremarkable. Lungs/Pleura: No pneumothorax or pleural effusion is noted. Bilateral posterior basilar opacities are noted concerning for atelectasis or aspiration pneumonia. Left upper lobe opacity is also noted concerning for pneumonia. Upper Abdomen: No acute abnormality. Musculoskeletal: No chest wall abnormality. No acute or significant osseous findings. Review of the MIP images confirms the above findings. IMPRESSION: 1. No definite evidence of pulmonary embolus. 2. Aspirated material is noted in the distal trachea and bilateral bronchi. 3. Bilateral posterior basilar opacities are noted concerning for atelectasis or aspiration pneumonia. Left upper lobe opacity is also noted concerning for pneumonia. Electronically Signed   By: JMarijo ConceptionM.D.   On: 08/12/2019 18:40   DG Chest Port 1 View  Result Date: 08/13/2019 CLINICAL DATA:  Endotracheal tube. EXAM: PORTABLE CHEST 1 VIEW COMPARISON:  March 10, 2019. FINDINGS: The heart size and mediastinal contours are within normal  limits. Endotracheal tube is seen in grossly good position. No pneumothorax or pleural effusion is noted. Both lungs are clear. The visualized skeletal structures are unremarkable. IMPRESSION:  Endotracheal tube in grossly good position. No acute cardiopulmonary abnormality seen. Electronically Signed   By: Marijo Conception M.D.   On: 08/13/2019 08:53   US LIVER DOPPLER  Result Date: 08/12/2019 CLINICAL DATA:  Elevated LFTs EXAM: DUPLEX ULTRASOUND OF LIVER TECHNIQUE: Color and duplex Doppler ultrasound was performed to evaluate the hepatic in-flow and out-flow vessels. COMPARISON:  None. FINDINGS: Main Portal Vein size: 1 cm Portal Vein Velocities (all hepatopetal): Main Prox:  27 cm/sec Main Mid: 25 cm/sec Main Dist:  22 cm/sec Right: 15 cm/sec Left: 15 cm/sec Hepatic Vein Velocities (all hepatofugal): Right:  23 cm/sec Middle:  18 cm/sec Left:  25 cm/sec IVC: Present and patent with normal respiratory phasicity. Velocity 76 cm/sec. Hepatic Artery Velocity:  55 cm/sec Splenic Vein Velocity:  14 cm/sec Spleen: 8.7 cm x 10.9 cm x 4.2 cm with a total volume of 209 cm^3 (411 cm^3 is upper limit normal) Portal Vein Occlusion/Thrombus: No Splenic Vein Occlusion/Thrombus: No Ascites: None Varices: None IMPRESSION: 1. Unremarkable hepatic vascular Doppler evaluation. Electronically Signed   By: Lucrezia Europe M.D.   On: 08/12/2019 13:19   US ABDOMEN LIMITED RUQ  Addendum Date: 08/12/2019   ADDENDUM REPORT: 08/12/2019 17:45 ADDENDUM: These results will be called to the ordering clinician or representative by the Radiologist Assistant, and communication documented in the PACS or zVision Dashboard. Electronically Signed   By: Zetta Bills M.D.   On: 08/12/2019 17:45   Result Date: 08/12/2019 CLINICAL DATA:  Elevated liver enzymes vomiting for 1 day EXAM: ULTRASOUND ABDOMEN LIMITED RIGHT UPPER QUADRANT COMPARISON:  None. FINDINGS: Gallbladder: No gallstones or wall thickening visualized. No sonographic Murphy sign noted by sonographer. Common bile duct: Diameter: 4 mm Liver: Moderately heterogeneous hepatic echotexture, suggestion of physeal widening and nodular contour. Area of variable hepatic echogenicity  measuring approximately 3.5 x 3.6 cm in the anterior right hepatic lobe (image 33 of 35. Also suggested on other images along the medial aspect of the right hemi liver, perhaps in subsegment V. on some images geographic increased echogenicity is displayed. Portal vein is patent on color Doppler imaging with normal direction of blood flow towards the liver. Other: No ascites. IMPRESSION: 1. Signs of cirrhosis with possible focal hepatic lesion versus is area of fatty infiltration. Multiphase imaging with CT or MR is suggested on follow-up. 2. No signs of cholecystitis or biliary calculi. Electronically Signed: By: Zetta Bills M.D. On: 08/12/2019 12:19    Thrombocytopenia ([place]) 54 year old female patient is currently admitted to hospital for hemoptysis/noted to have bilateral pneumonia with thrombocytopenia.  #Thrombocytopenia-moderate 100-1 10-etiology is unclear.  Most likely benign causes like-liver disease cirrhosis versus ITP.  Do not think mild thrombocytopenia because of patient's hemoptysis.  Limited right upper quadrant abdominal ultrasound shows-cirrhosis-multifocal area 3.5 centimeter fatty sparing versus lesion.  Recommend AFP; also recommend MRI of the liver with and without contrast when patient is clinically stable.  Will check AFP.  #Acute respiratory failure secondary to -bilateral pneumonia/hemoptysis-as per ICU team.  Currently s/p extubation.   #Cirrhosis-question etiology s/p EGD.  Work-up in progress; followed by GI.  Thank you Dr. Patsey Berthold for allowing me to participate in the care of your pleasant patient. Please do not hesitate to contact me with questions or concerns in the interim.   All questions were answered. The patient knows to call the clinic  with any problems, questions or concerns.    Cammie Sickle, MD 08/14/2019 8:27 PM

## 2019-08-14 NOTE — Progress Notes (Signed)
Initial Nutrition Assessment  DOCUMENTATION CODES:   Not applicable  INTERVENTION:  Provide Pro-Stat 30 mL po TID between meals, each supplement provides 100 kcal and 15 grams of protein.  Once diet advances provide Ensure Max Protein po BID, each supplement provides 150 kcal and 30 grams of protein.  NUTRITION DIAGNOSIS:   Unintentional weight loss related to (possible catabolic illness) as evidenced by (report of 10.8% weight loss despite unchanged/good appetite and intake).  GOAL:   Patient will meet greater than or equal to 90% of their needs  MONITOR:   PO intake, Supplement acceptance, Diet advancement, Labs, Weight trends, I & O's  REASON FOR ASSESSMENT:   Malnutrition Screening Tool    ASSESSMENT:   54 year old female with PMHx of DM, HTN who was admitted with hematemesis s/p urgent EGD which was negative, was admitted to ICU for severe respiratory failure and what seemed to be severe hemoptysis requiring intubation from 2/2-2/3.   Patient is currently on precautions while TB is being ruled out. Spoke to her over the phone with assistance from [place] interpreter Government social research officer). Patient reports her appetite is good and unchanged from baseline. She reports she is hungry today and has only had water so far. Noted her diet was just advanced to clear liquids. Discussed that she should be getting a tray soon of clear liquids for lunch but it would be up to MD on when diet is advanced past clear liquids. She reports she was eating well PTA and eats adequate protein at meals.  Patient does reports she has had unintentional weight loss lately. Her UBW was 138 lbs and she has lost about 15-16 lbs over unknown time frame. This is a weight loss of approximately 10.9% body weight. Patient is currently 56.6 kg (124.78 lbs). Patient reports she feels weaker since the weight loss.  Medications reviewed and include: Novolog 0-15 units Q4hrs, Solu-Medrol 40 mg Q12hrs IV, azithromycin,  ceftriaxone, famotidine.  Labs reviewed: CBG 135-215, Creatinine 0.32.  Discussed on rounds.  NUTRITION - FOCUSED PHYSICAL EXAM:  Unable to complete at this time.  Diet Order:   Diet Order            Diet clear liquid Room service appropriate? Yes; Fluid consistency: Thin  Diet effective now             EDUCATION NEEDS:   Education needs have been addressed  Skin:  Skin Assessment: Reviewed RN Assessment  Last BM:  Unknown  Height:   Ht Readings from Last 1 Encounters:  08/12/19 4' 9.01" (1.448 m)   Weight:   Wt Readings from Last 1 Encounters:  08/14/19 56.6 kg   Ideal Body Weight:  43.2 kg  BMI:  Body mass index is 26.99 kg/m.  Estimated Nutritional Needs:   Kcal:  1500-1700  Protein:  75-85 grams  Fluid:  1.5-1.7 L/day  Jacklynn Barnacle, MS, RD, LDN Pager number available on Amion

## 2019-08-14 NOTE — Consult Note (Signed)
[facility] Vascular Consult Note  MRN : [id number]  Vickie Smith is a 54 y.o. (March 19, 1966) female who presents with chief complaint of  Chief Complaint  Patient presents with  . Hemoptysis   History of Present Illness:  The patient is a 54 year old female with a past medical history of diabetes who presented to the [facility] emergency department complaining of multiple episodes of "bloody vomit".  Patient endorses seeking medical attention at the [facility] Emergency Department after experiencing 3 episodes of bloody emesis with associated abdominal pain and fatigue.  She denied experiencing any fever, shortness of breath or chest pain at the time.  Gastroenterology was consulted and scope the patient.  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   The patient was admitted to the ICU for severe respiratory failure and intubated emergently.  Patient was diagnosed with Covid 19 in August 2020.  Patient underwent a bronchoscopy and was found not to have any obvious site of bleeding.    Patient's massive hemoptysis has improved however continued oozing is still noted by ICU staff.  Vascular surgery was consulted by Dr. Mortimer Fries for possible pulmonary angiogram.  Current Facility-Administered Medications  Medication Dose Route Frequency Provider Last Rate Last Admin  . 0.9 %  sodium chloride infusion   Intravenous Continuous Ivor Costa, MD 100 mL/hr at 08/14/19 1616 New Bag at 08/14/19 1616  . 0.9 %  sodium chloride infusion  250 mL Intravenous PRN Flora Lipps, MD   Stopped at 08/13/19 1255  . acetaminophen (TYLENOL) tablet 650 mg  650 mg Oral Q4H PRN Flora Lipps, MD      . azithromycin (ZITHROMAX) 500 mg in sodium chloride 0.9 % 250 mL IVPB  500 mg Intravenous Q24H Flora Lipps, MD   Stopped at 08/13/19 1837  . ceFAZolin (ANCEF) 2-4  GM/100ML-% IVPB           . cefTRIAXone (ROCEPHIN) 2 g in sodium chloride 0.9 % 100 mL IVPB  2 g Intravenous Q24H Awilda Bill, NP   Stopped at 08/13/19 1910  . Chlorhexidine Gluconate Cloth 2 % PADS 6 each  6 each Topical Daily Flora Lipps, MD   6 each at 08/14/19 1227  . famotidine (PEPCID) IVPB 20 mg premix  20 mg Intravenous Q12H Flora Lipps, MD   Stopped at 08/14/19 [phone]  . feeding supplement (PRO-STAT SUGAR FREE 64) liquid 30 mL  30 mL Oral TID BM Tyler Pita, MD   Stopped at 08/14/19 1555  . fentaNYL (SUBLIMAZE) bolus via infusion 50-100 mcg  50-100 mcg Intravenous Q2H PRN Awilda Bill, NP      . HYDROmorphone (DILAUDID) injection 1 mg  1 mg Intravenous Once PRN Deby Adger A, PA-C      . insulin aspart (novoLOG) injection 0-15 Units  0-15 Units Subcutaneous Q4H Dallie Piles, RPH   3 Units at 08/14/19 1616  . lip balm (BLISTEX) ointment   Topical PRN Tyler Pita, MD      . methylPREDNISolone sodium succinate (SOLU-MEDROL) 40 mg/mL injection 40 mg  40 mg Intravenous Q12H Flora Lipps, MD   40 mg at 08/14/19 0821  . midazolam (VERSED) injection 2 mg  2 mg Intravenous Q15 min PRN Flora Lipps, MD   2 mg at 08/12/19 1825  . midazolam (VERSED) injection 2 mg  2 mg Intravenous Q2H PRN Flora Lipps, MD      .  ondansetron (ZOFRAN) injection 4 mg  4 mg Intravenous Q6H PRN Flora Lipps, MD   4 mg at 08/14/19 0853  . sennosides (SENOKOT) 8.8 MG/5ML syrup 5 mL  5 mL Per Tube BID PRN Flora Lipps, MD      . sodium chloride flush (NS) 0.9 % injection 3 mL  3 mL Intravenous Q12H Flora Lipps, MD   3 mL at 08/13/19 2224  . sodium chloride flush (NS) 0.9 % injection 3 mL  3 mL Intravenous PRN Flora Lipps, MD      . vecuronium (NORCURON) injection 10 mg  10 mg Intravenous Q1H PRN Flora Lipps, MD   10 mg at 08/12/19 1854   Past Medical History:  Diagnosis Date  . Diabetes mellitus without complication [facility])    Past Surgical History:  Procedure Laterality Date  .  ESOPHAGOGASTRODUODENOSCOPY N/A 08/12/2019   Procedure: ESOPHAGOGASTRODUODENOSCOPY (EGD);  Surgeon: Lin Landsman, MD;  Location: [facility] ENDOSCOPY;  Service: Gastroenterology;  Laterality: N/A;   Social History Social History   Tobacco Use  . Smoking status: Never Smoker  . Smokeless tobacco: Never Used  Substance Use Topics  . Alcohol use: Never  . Drug use: Never   Family History History reviewed. No pertinent family history.  Denies family history of peripheral artery disease, renal disease or venous disease.  No Known Allergies  REVIEW OF SYSTEMS (Negative unless checked)  Constitutional: [] Weight loss  [] Fever  [] Chills Cardiac: [] Chest pain   [] Chest pressure   [] Palpitations   [] Shortness of breath when laying flat   [] Shortness of breath at rest   [] Shortness of breath with exertion. Vascular:  [] Pain in legs with walking   [] Pain in legs at rest   [] Pain in legs when laying flat   [] Claudication   [] Pain in feet when walking  [] Pain in feet at rest  [] Pain in feet when laying flat   [] History of DVT   [] Phlebitis   [] Swelling in legs   [] Varicose veins   [] Non-healing ulcers Pulmonary:   [] Uses home oxygen   [] Productive cough   [] Hemoptysis   [] Wheeze  [] COPD   [] Asthma Neurologic:  [] Dizziness  [] Blackouts   [] Seizures   [] History of stroke   [] History of TIA  [] Aphasia   [] Temporary blindness   [] Dysphagia   [] Weakness or numbness in arms   [] Weakness or numbness in legs Musculoskeletal:  [] Arthritis   [] Joint swelling   [] Joint pain   [] Low back pain Hematologic:  [] Easy bruising  [] Easy bleeding   [] Hypercoagulable state   [] Anemic  [] Hepatitis Gastrointestinal:  [] Blood in stool   [] Vomiting blood  [] Gastroesophageal reflux/heartburn   [] Difficulty swallowing. Genitourinary:  [] Chronic kidney disease   [] Difficult urination  [] Frequent urination  [] Burning with urination   [] Blood in urine Skin:  [] Rashes   [] Ulcers   [] Wounds Psychological:  [] History of anxiety   []   History of major depression.  Positive for hemoptysis, abdominal pain and nausea.  Physical Examination  Vitals:   08/14/19 1600 08/14/19 1700 08/14/19 1730 08/14/19 1734  BP: 99/68 117/78 98/67 98/67   Pulse: 70 72 67 68  Resp: 10 11 17 12   Temp: 98.2 F (36.8 C)   98.4 F (36.9 C)  TempSrc: Oral   Axillary  SpO2: 97% 97% 94% 100%  Weight:    56.6 kg  Height:    4' 9"  (1.448 m)   Body mass index is 27 kg/m. Gen:  WD/WN, NAD Head: [place]/AT, No temporalis wasting. Prominent temp pulse not noted.  Ear/Nose/Throat: Hearing grossly intact, nares w/o erythema or drainage, oropharynx w/o Erythema/Exudate Eyes: Sclera non-icteric, conjunctiva clear Neck: Trachea midline.  No JVD.  Pulmonary:  Good air movement, respirations not labored, equal bilaterally.  Cardiac: RRR, normal S1, S2. Vascular:  Vessel Right Left  Radial Palpable Palpable  Ulnar Palpable Palpable  Brachial Palpable Palpable  Carotid Palpable, without bruit Palpable, without bruit  Aorta Not palpable N/A  Femoral Palpable Palpable  Popliteal Palpable Palpable  PT Palpable Palpable  DP Palpable Palpable   Gastrointestinal: soft, non-tender/non-distended. No guarding/reflex.  Musculoskeletal: M/S 5/5 throughout.  Extremities without ischemic changes.  No deformity or atrophy. No edema. Neurologic: Sensation grossly intact in extremities.  Symmetrical.  Speech is fluent. Motor exam as listed above. Psychiatric: Judgment intact, Mood & affect appropriate for pt's clinical situation. Dermatologic: No rashes or ulcers noted.  No cellulitis or open wounds. Lymph : No Cervical, Axillary, or Inguinal lymphadenopathy.  CBC Lab Results  Component Value Date   WBC 10.8 (H) 08/14/2019   HGB 12.4 08/14/2019   HCT 36.5 08/14/2019   MCV 92.6 08/14/2019   PLT 100 (L) 08/14/2019   BMET    Component Value Date/Time   NA 139 08/14/2019 0456   K 3.9 08/14/2019 0456   CL 107 08/14/2019 0456   CO2 24 08/14/2019 0456    GLUCOSE 207 (H) 08/14/2019 0456   BUN 13 08/14/2019 0456   CREATININE 0.32 (L) 08/14/2019 0456   CALCIUM 8.5 (L) 08/14/2019 0456   GFRNONAA >60 08/14/2019 0456   GFRAA >60 08/14/2019 0456   Estimated Creatinine Clearance: 58.8 mL/min (A) (by C-G formula based on SCr of 0.32 mg/dL (L)).  COAG Lab Results  Component Value Date   INR 1.0 08/12/2019   Radiology CT ANGIO CHEST PE W OR WO CONTRAST  Result Date: 08/12/2019 CLINICAL DATA:  Hemoptysis. EXAM: CT ANGIOGRAPHY CHEST WITH CONTRAST TECHNIQUE: Multidetector CT imaging of the chest was performed using the standard protocol during bolus administration of intravenous contrast. Multiplanar CT image reconstructions and MIPs were obtained to evaluate the vascular anatomy. CONTRAST:  49m OMNIPAQUE IOHEXOL 350 MG/ML SOLN COMPARISON:  None. FINDINGS: Cardiovascular: Satisfactory opacification of the pulmonary arteries to the segmental level. No evidence of pulmonary embolism. Normal heart size. No pericardial effusion. Mediastinum/Nodes: Endotracheal tube is in grossly good position. Aspirated material is noted in the distal trachea and bilateral bronchi. No adenopathy is noted. Thyroid gland is unremarkable. Lungs/Pleura: No pneumothorax or pleural effusion is noted. Bilateral posterior basilar opacities are noted concerning for atelectasis or aspiration pneumonia. Left upper lobe opacity is also noted concerning for pneumonia. Upper Abdomen: No acute abnormality. Musculoskeletal: No chest wall abnormality. No acute or significant osseous findings. Review of the MIP images confirms the above findings. IMPRESSION: 1. No definite evidence of pulmonary embolus. 2. Aspirated material is noted in the distal trachea and bilateral bronchi. 3. Bilateral posterior basilar opacities are noted concerning for atelectasis or aspiration pneumonia. Left upper lobe opacity is also noted concerning for pneumonia. Electronically Signed   By: JMarijo ConceptionM.D.   On:  08/12/2019 18:40   DG Chest Port 1 View  Result Date: 08/13/2019 CLINICAL DATA:  Endotracheal tube. EXAM: PORTABLE CHEST 1 VIEW COMPARISON:  March 10, 2019. FINDINGS: The heart size and mediastinal contours are within normal limits. Endotracheal tube is seen in grossly good position. No pneumothorax or pleural effusion is noted. Both lungs are clear. The visualized skeletal structures are unremarkable. IMPRESSION: Endotracheal tube in grossly good position. No acute  cardiopulmonary abnormality seen. Electronically Signed   By: Marijo Conception M.D.   On: 08/13/2019 08:53   US LIVER DOPPLER  Result Date: 08/12/2019 CLINICAL DATA:  Elevated LFTs EXAM: DUPLEX ULTRASOUND OF LIVER TECHNIQUE: Color and duplex Doppler ultrasound was performed to evaluate the hepatic in-flow and out-flow vessels. COMPARISON:  None. FINDINGS: Main Portal Vein size: 1 cm Portal Vein Velocities (all hepatopetal): Main Prox:  27 cm/sec Main Mid: 25 cm/sec Main Dist:  22 cm/sec Right: 15 cm/sec Left: 15 cm/sec Hepatic Vein Velocities (all hepatofugal): Right:  23 cm/sec Middle:  18 cm/sec Left:  25 cm/sec IVC: Present and patent with normal respiratory phasicity. Velocity 76 cm/sec. Hepatic Artery Velocity:  55 cm/sec Splenic Vein Velocity:  14 cm/sec Spleen: 8.7 cm x 10.9 cm x 4.2 cm with a total volume of 209 cm^3 (411 cm^3 is upper limit normal) Portal Vein Occlusion/Thrombus: No Splenic Vein Occlusion/Thrombus: No Ascites: None Varices: None IMPRESSION: 1. Unremarkable hepatic vascular Doppler evaluation. Electronically Signed   By: Lucrezia Europe M.D.   On: 08/12/2019 13:19   US ABDOMEN LIMITED RUQ  Addendum Date: 08/12/2019   ADDENDUM REPORT: 08/12/2019 17:45 ADDENDUM: These results will be called to the ordering clinician or representative by the Radiologist Assistant, and communication documented in the PACS or zVision Dashboard. Electronically Signed   By: Zetta Bills M.D.   On: 08/12/2019 17:45   Result Date:  08/12/2019 CLINICAL DATA:  Elevated liver enzymes vomiting for 1 day EXAM: ULTRASOUND ABDOMEN LIMITED RIGHT UPPER QUADRANT COMPARISON:  None. FINDINGS: Gallbladder: No gallstones or wall thickening visualized. No sonographic Murphy sign noted by sonographer. Common bile duct: Diameter: 4 mm Liver: Moderately heterogeneous hepatic echotexture, suggestion of physeal widening and nodular contour. Area of variable hepatic echogenicity measuring approximately 3.5 x 3.6 cm in the anterior right hepatic lobe (image 33 of 35. Also suggested on other images along the medial aspect of the right hemi liver, perhaps in subsegment V. on some images geographic increased echogenicity is displayed. Portal vein is patent on color Doppler imaging with normal direction of blood flow towards the liver. Other: No ascites. IMPRESSION: 1. Signs of cirrhosis with possible focal hepatic lesion versus is area of fatty infiltration. Multiphase imaging with CT or MR is suggested on follow-up. 2. No signs of cholecystitis or biliary calculi. Electronically Signed: By: Zetta Bills M.D. On: 08/12/2019 12:19   Assessment/Plan The patient is a 54 year old female with a past medical history of diabetes who presented to the [facility] emergency department complaining of multiple episodes of "bloody vomit".  1.  Acute hypoxic respiratory failure due to massive hemoptysis Patient has been extubated.  There was no obvious site of bleeding on bronchoscopy however patient continues to show signs of active pulmonary bleeding.  Recommend undergoing a pulmonary angiogram in attempt to assess the patient's anatomy and locate a source of bleeding.  If a source of bleeding is found, an attempt to embolize can be made at that time.  Procedure, risks and benefits explained to the patient through the use of an interpreter.  All questions were answered.  The patient wishes to proceed.  2. Diabetes: On appropriate medications.  Encouraged good control as its slows the progression of atherosclerotic disease  Discussed with Dr. Francene Castle, PA-C  08/14/2019 6:50 PM  This note was created with Dragon medical transcription system.  Any error is purely unintentional

## 2019-08-15 ENCOUNTER — Encounter
Admission: EM | Disposition: A | Discharge status: Other Acute Inpatient Hospital | Payer: Self-pay | Source: Home / Self Care | Attending: Internal Medicine

## 2019-08-15 DIAGNOSIS — J690 Pneumonitis due to inhalation of food and vomit: Secondary | ICD-10-CM

## 2019-08-15 DIAGNOSIS — I959 Hypotension, unspecified: Secondary | ICD-10-CM

## 2019-08-15 DIAGNOSIS — E1165 Type 2 diabetes mellitus with hyperglycemia: Secondary | ICD-10-CM

## 2019-08-15 DIAGNOSIS — R042 Hemoptysis: Secondary | ICD-10-CM

## 2019-08-15 HISTORY — PX: EMBOLIZATION: CATH118239

## 2019-08-15 LAB — PROTIME-INR
INR: 1.2 (ref 0.8–1.2)
Prothrombin Time: 14.7 seconds (ref 11.4–15.2)

## 2019-08-15 LAB — ENA+DNA/DS+SJORGEN'S
ENA SM Ab Ser-aCnc: <0.2 AI (ref 0.0–0.9)
Ribonucleic Protein: <0.2 AI (ref 0.0–0.9)
SSA (Ro) (ENA) Antibody, IgG: <0.2 AI (ref 0.0–0.9)
SSB (La) (ENA) Antibody, IgG: 4.7 AI — ABNORMAL HIGH (ref 0.0–0.9)
ds DNA Ab: 1 IU/mL (ref 0–9)

## 2019-08-15 LAB — CULTURE, RESPIRATORY W GRAM STAIN
Culture: NO GROWTH
Gram Stain: NONE SEEN

## 2019-08-15 LAB — GLUCOSE, CAPILLARY
Glucose-Capillary: 149 mg/dL — ABNORMAL HIGH (ref 70–99)
Glucose-Capillary: 183 mg/dL — ABNORMAL HIGH (ref 70–99)
Glucose-Capillary: 204 mg/dL — ABNORMAL HIGH (ref 70–99)
Glucose-Capillary: 221 mg/dL — ABNORMAL HIGH (ref 70–99)
Glucose-Capillary: 237 mg/dL — ABNORMAL HIGH (ref 70–99)
Glucose-Capillary: 345 mg/dL — ABNORMAL HIGH (ref 70–99)
Glucose-Capillary: 365 mg/dL — ABNORMAL HIGH (ref 70–99)

## 2019-08-15 LAB — CBC
HCT: 36.9 % (ref 36.0–46.0)
Hemoglobin: 12.6 g/dL (ref 12.0–15.0)
MCH: 31.3 pg (ref 26.0–34.0)
MCHC: 34.1 g/dL (ref 30.0–36.0)
MCV: 91.8 fL (ref 80.0–100.0)
Platelets: 108 10*3/uL — ABNORMAL LOW (ref 150–400)
RBC: 4.02 MIL/uL (ref 3.87–5.11)
RDW: 12.4 % (ref 11.5–15.5)
WBC: 11.1 10*3/uL — ABNORMAL HIGH (ref 4.0–10.5)
nRBC: 0 % (ref 0.0–0.2)

## 2019-08-15 LAB — BASIC METABOLIC PANEL
Anion gap: 9 (ref 5–15)
BUN: 14 mg/dL (ref 6–20)
CO2: 22 mmol/L (ref 22–32)
Calcium: 8.6 mg/dL — ABNORMAL LOW (ref 8.9–10.3)
Chloride: 109 mmol/L (ref 98–111)
Creatinine, Ser: 0.37 mg/dL — ABNORMAL LOW (ref 0.44–1.00)
GFR calc Af Amer: >60 mL/min (ref >60)
GFR calc non Af Amer: >60 mL/min (ref >60)
Glucose, Bld: 151 mg/dL — ABNORMAL HIGH (ref 70–99)
Potassium: 3.8 mmol/L (ref 3.5–5.1)
Sodium: 140 mmol/L (ref 135–145)

## 2019-08-15 LAB — ANA W/REFLEX: Anti Nuclear Antibody (ANA): POSITIVE — AB

## 2019-08-15 LAB — TRIGLYCERIDES: Triglycerides: 132 mg/dL (ref <150)

## 2019-08-15 LAB — MITOCHONDRIAL ANTIBODIES: Mitochondrial M2 Ab, IgG: <20 Units (ref 0.0–20.0)

## 2019-08-15 SURGERY — EMBOLIZATION
Anesthesia: Moderate Sedation

## 2019-08-15 MED ORDER — SODIUM CHLORIDE 0.9 % IV SOLN: INTRAVENOUS | Status: DC

## 2019-08-15 MED ORDER — CEFAZOLIN SODIUM-DEXTROSE 2-4 GM/100ML-% IV SOLN: 2.0000 g | Freq: Once | INTRAVENOUS | Status: DC

## 2019-08-15 MED ORDER — HEPARIN SODIUM (PORCINE) 1000 UNIT/ML IJ SOLN
INTRAMUSCULAR | Status: DC | PRN
  Administered 2019-08-15: 3000 [IU] via INTRAVENOUS

## 2019-08-15 MED ORDER — ONDANSETRON HCL 4 MG/2ML IJ SOLN: 4.0000 mg | Freq: Four times a day (QID) | INTRAMUSCULAR | Status: DC | PRN

## 2019-08-15 MED ORDER — HYDROMORPHONE HCL 1 MG/ML IJ SOLN: 1.0000 mg | INTRAMUSCULAR | Status: DC | PRN

## 2019-08-15 MED ORDER — MIDAZOLAM HCL 5 MG/5ML IJ SOLN
INTRAMUSCULAR | Status: AC
  Filled 2019-08-15: qty 5

## 2019-08-15 MED ORDER — MIDAZOLAM HCL 2 MG/ML PO SYRP: 8.0000 mg | ORAL_SOLUTION | Freq: Once | ORAL | Status: DC | PRN

## 2019-08-15 MED ORDER — SODIUM CHLORIDE 0.9% FLUSH: 3.0000 mL | INTRAVENOUS | Status: DC | PRN

## 2019-08-15 MED ORDER — DIPHENHYDRAMINE HCL 50 MG/ML IJ SOLN: 50.0000 mg | Freq: Once | INTRAMUSCULAR | Status: DC | PRN

## 2019-08-15 MED ORDER — LABETALOL HCL 5 MG/ML IV SOLN: 10.0000 mg | INTRAVENOUS | Status: DC | PRN

## 2019-08-15 MED ORDER — MIDAZOLAM HCL 2 MG/2ML IJ SOLN
INTRAMUSCULAR | Status: DC | PRN
  Administered 2019-08-15: 1 mg via INTRAVENOUS
  Administered 2019-08-15: 0.5 mg via INTRAVENOUS
  Administered 2019-08-15 (×4): 1 mg via INTRAVENOUS

## 2019-08-15 MED ORDER — SODIUM CHLORIDE 0.9% FLUSH
3.0000 mL | Freq: Two times a day (BID) | INTRAVENOUS | Status: DC
  Administered 2019-08-15 – 2019-08-17 (×4): 3 mL via INTRAVENOUS

## 2019-08-15 MED ORDER — MORPHINE SULFATE (PF) 4 MG/ML IV SOLN: 2.0000 mg | INTRAVENOUS | Status: DC | PRN

## 2019-08-15 MED ORDER — INSULIN ASPART 100 UNIT/ML ~~LOC~~ SOLN
0.0000 [IU] | Freq: Three times a day (TID) | SUBCUTANEOUS | Status: DC
  Administered 2019-08-15 – 2019-08-16 (×2): 9 [IU] via SUBCUTANEOUS
  Administered 2019-08-16: 08:00:00 1 [IU] via SUBCUTANEOUS
  Filled 2019-08-15 (×4): qty 1

## 2019-08-15 MED ORDER — METHYLPREDNISOLONE SODIUM SUCC 125 MG IJ SOLR: 125.0000 mg | Freq: Once | INTRAMUSCULAR | Status: DC | PRN

## 2019-08-15 MED ORDER — HEPARIN SODIUM (PORCINE) 1000 UNIT/ML IJ SOLN
INTRAMUSCULAR | Status: AC
  Filled 2019-08-15: qty 1

## 2019-08-15 MED ORDER — INSULIN ASPART 100 UNIT/ML ~~LOC~~ SOLN
5.0000 [IU] | Freq: Once | SUBCUTANEOUS | Status: AC
  Administered 2019-08-15: 5 [IU] via SUBCUTANEOUS
  Filled 2019-08-15: qty 1

## 2019-08-15 MED ORDER — INSULIN ASPART 100 UNIT/ML ~~LOC~~ SOLN
0.0000 [IU] | Freq: Every day | SUBCUTANEOUS | Status: DC
  Administered 2019-08-15: 22:00:00 4 [IU] via SUBCUTANEOUS
  Filled 2019-08-15: qty 1

## 2019-08-15 MED ORDER — HYDROMORPHONE HCL 1 MG/ML IJ SOLN: 1.0000 mg | Freq: Once | INTRAMUSCULAR | Status: DC | PRN

## 2019-08-15 MED ORDER — FENTANYL CITRATE (PF) 100 MCG/2ML IJ SOLN
INTRAMUSCULAR | Status: DC | PRN
  Administered 2019-08-15 (×2): 50 ug via INTRAVENOUS
  Administered 2019-08-15: 12.5 ug via INTRAVENOUS
  Administered 2019-08-15 (×2): 25 ug via INTRAVENOUS
  Administered 2019-08-15: 12.5 ug via INTRAVENOUS

## 2019-08-15 MED ORDER — OXYCODONE HCL 5 MG PO TABS: 5.0000 mg | ORAL_TABLET | ORAL | Status: DC | PRN

## 2019-08-15 MED ORDER — PROMETHAZINE HCL 25 MG/ML IJ SOLN
12.5000 mg | Freq: Once | INTRAMUSCULAR | Status: DC
  Filled 2019-08-15: qty 1

## 2019-08-15 MED ORDER — FENTANYL CITRATE (PF) 100 MCG/2ML IJ SOLN
INTRAMUSCULAR | Status: AC
  Filled 2019-08-15: qty 2

## 2019-08-15 MED ORDER — HYDRALAZINE HCL 20 MG/ML IJ SOLN: 5.0000 mg | INTRAMUSCULAR | Status: DC | PRN

## 2019-08-15 MED ORDER — METHYLPREDNISOLONE SODIUM SUCC 40 MG IJ SOLR
40.0000 mg | Freq: Every day | INTRAMUSCULAR | Status: DC
  Administered 2019-08-16 – 2019-08-17 (×2): 40 mg via INTRAVENOUS
  Filled 2019-08-15 (×2): qty 1

## 2019-08-15 MED ORDER — FAMOTIDINE 20 MG PO TABS: 40.0000 mg | ORAL_TABLET | Freq: Once | ORAL | Status: DC | PRN

## 2019-08-15 MED ORDER — SODIUM CHLORIDE 0.9 % IV SOLN: 250.0000 mL | INTRAVENOUS | Status: DC | PRN

## 2019-08-15 MED ORDER — ACETAMINOPHEN 325 MG PO TABS: 650.0000 mg | ORAL_TABLET | ORAL | Status: DC | PRN

## 2019-08-15 MED ORDER — SODIUM CHLORIDE 0.9 % IV SOLN
INTRAVENOUS | Status: DC | PRN
  Administered 2019-08-15: 250 mL via INTRAVENOUS

## 2019-08-15 MED ORDER — IODIXANOL 320 MG/ML IV SOLN
INTRAVENOUS | Status: DC | PRN
  Administered 2019-08-15: 155 mL via INTRA_ARTERIAL

## 2019-08-15 SURGICAL SUPPLY — 22 items
BLOCK BEAD 500-700 (Vascular Products) ×3 IMPLANT
BLOCK BEAD 700-900 (Vascular Products) ×3 IMPLANT
CATH ANGIO 5F 100CM .035 PIG (CATHETERS) ×3 IMPLANT
CATH BEACON 5 .035 100 JB1 TIP (CATHETERS) ×3 IMPLANT
CATH INFINITI 5FR AL1 (CATHETERS) ×3 IMPLANT
CATH MICROCATH PRGRT 2.8F 110 (CATHETERS) ×1 IMPLANT
CATH VS15FR (CATHETERS) ×3 IMPLANT
COIL 400 COMPLEX SOFT 2X4CM (Vascular Products) ×9 IMPLANT
DEVICE STARCLOSE SE CLOSURE (Vascular Products) ×3 IMPLANT
DEVICE TORQUE .025-.038 (MISCELLANEOUS) ×3 IMPLANT
GLIDEWIRE ADV .035X180CM (WIRE) ×3 IMPLANT
GLIDEWIRE ANGLED SS 035X260CM (WIRE) ×3 IMPLANT
GUIDEWIRE ANGLED .035X260CM (WIRE) ×3 IMPLANT
HANDLE DETACHMENT COIL (MISCELLANEOUS) ×3 IMPLANT
MICROCATH PROGREAT 2.8F 110 CM (CATHETERS) ×3
NEEDLE ENTRY 21GA 7CM ECHOTIP (NEEDLE) ×3 IMPLANT
PACK ANGIOGRAPHY (CUSTOM PROCEDURE TRAY) ×3 IMPLANT
SET INTRO CAPELLA COAXIAL (SET/KITS/TRAYS/PACK) ×3 IMPLANT
SHEATH BRITE TIP 5FRX11 (SHEATH) ×3 IMPLANT
SYR MEDRAD MARK 7 150ML (SYRINGE) ×3 IMPLANT
TUBING CONTRAST HIGH PRESS 72 (TUBING) ×3 IMPLANT
WIRE J 3MM .035X145CM (WIRE) ×3 IMPLANT

## 2019-08-15 NOTE — Progress Notes (Signed)
Pt. With active hemoptysis x 3 (approx. 40 ml bright red blood) on arrival to Special Procedures. Oral care given. Pt. Voided 600 ml via Wick catheter on arrival. Pt. States entire throat hurts secondary to coughing frequently and active hemoptysis. Interpretor Jacqui present for pre-vascular procedure. Pt. Speaks some English. Stable for procedure at present.

## 2019-08-15 NOTE — Op Note (Signed)
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  Arch aortogram with descending thoracic aortogram  2.  Selective injection of the right bronchial artery  3.  Selective injection of the left bronchial artery  4.  Embolization of the left upper lobe bronchial artery with a combination of 500 to 700 m PVC beads, 700 to 900 m PVC beads and 3 Ruby coils  5.  StarClose right common femoral artery   Anesthesia: Conscious sedation was administered by the interventional radiology RN under my direct supervision. IV Versed plus fentanyl were utilized. Continuous ECG, pulse oximetry and blood pressure was monitored throughout the entire procedure. Conscious sedation was administered for a total of 105 minutes.  Sheath: 5 French sheath retrograde right common femoral artery  Contrast: 155 cc   Fluoroscopy Time: 23.4 minutes  Indications: Patient now has recurrence of her pulmonary hemorrhage.  Procedure:  Vickie Smith a 54 y.o. female who was identified and appropriate procedural time out was performed.  The patient was then placed supine on the table and prepped and draped in the usual sterile fashion.  Ultrasound was used to evaluate the right common femoral artery.  It was echolucent and pulsatile indicating it is patent .  An ultrasound image was acquired for the permanent record.  A micropuncture needle was used to access the right common femoral artery under direct ultrasound guidance.  The microwire was then advanced under fluoroscopic guidance without difficulty followed by the micro-sheath.  A 0.035 J wire was advanced without resistance and a 5Fr sheath was placed.    A pigtail catheter was then advanced into the descending  thoracic aorta and thoracic aortogram was performed.  Given the limitations of the system that we are using no obvious bronchial arteries were noted.  Several other thoracic and arch images were performed in both AP and oblique imaging.  Ultimately we were able to establish what appeared to be to bronchial arteries.  Using a combination of a VS 1 catheter as well as a JB 1 catheter as well as an advantage wire and a floppy Glidewire the first bronchial to be selected was the lower branch of the 2 identified.  A prograde catheter was then advanced out the 035 catheter and hand-injection contrast demonstrated this was the right upper bronchial artery.  There was no evidence of extravasation and there was no evidence of significant collateral formation.  The upper artery was then selected again once engaged with the 035 catheter prograde was advanced out.  Hand-injection contrast demonstrated this branch tract directly to the left upper lobe.  It also demonstrated rather severe extravasation consistent with her hemorrhage.  Furthermore, there did not appear to be any filling of an artery that would be consistent with the spinal artery.  There appeared to be filling of the left upper lung field only.  I therefore elected to proceed with embolization.  Initially I selected large PVC beads in the 700 to 900 m range.  Half cc of beads was instilled.  Next I repositioned the prograde slightly and a full cc of 500 to 700 m beads.  I then repositioned the prograde catheter slightly and deployed three 2 mm x 4 cm Ruby coils.  Follow-up imaging through the catheter now demonstrated complete cessation of extravasation with  successful embolization.  Based on this image I elected to terminate the procedure.  The catheters were removed J-wire was advanced an oblique view of the right groin was obtained and a Star close device deployed.    Disposition: Patient was taken to the recovery room in stable condition having  tolerated the procedure well.  [name] 08/15/2019,2:48 PM

## 2019-08-15 NOTE — Progress Notes (Signed)
Patient ID: Vickie Smith, female   DOB: [birth year], 55 y.o.   MRN: [id number] [facility] PROGRESS NOTE  Kadijah Shamoon KZL:[id number] DOB: 27-Jan-1966 DOA: 08/12/2019 PCP: [facility] [employer]  HPI/Subjective: Patient has been coughing up blood for a few days now.  Had embolization this morning of left upper lobe by Dr. Delana Meyer vascular surgery.  Still coughing up slight blood but less than previous.  Has some chest discomfort.  Objective: Vitals:   08/15/19 1244 08/15/19 1300  BP: 106/72 99/68  Pulse: 64 60  Resp: 14 14  Temp: 98.2 F (36.8 C)   SpO2: 97% 96%    Intake/Output Summary (Last 24 hours) at 08/15/2019 1524 Last data filed at 08/15/2019 1215 Gross per 24 hour  Intake 1107.69 ml  Output 2350 ml  Net -1242.31 ml   Filed Weights   08/14/19 1734 08/15/19 0452 08/15/19 0910  Weight: 56.6 kg 54.5 kg 54.5 kg    ROS: Review of Systems  Constitutional: Negative for chills and fever.  Eyes: Negative for blurred vision.  Respiratory: Positive for cough, hemoptysis and shortness of breath.   Cardiovascular: Positive for chest pain.  Gastrointestinal: Negative for abdominal pain, constipation, diarrhea, nausea and vomiting.  Genitourinary: Negative for dysuria.  Musculoskeletal: Negative for joint pain.  Neurological: Negative for dizziness and headaches.   Exam: Physical Exam  Constitutional: She is oriented to person, place, and time.  HENT:  Nose: No mucosal edema.  Mouth/Throat: No oropharyngeal exudate or posterior oropharyngeal edema.  Eyes: Pupils are equal, round, and reactive to light. Conjunctivae, EOM and lids are normal.  Neck: Carotid bruit is not present.  Cardiovascular: S1 normal and S2 normal. Exam reveals no gallop.  No murmur heard. Respiratory: No respiratory distress. She has decreased breath sounds in the right lower field and the left lower field. She has no wheezes. She has no rhonchi. She has no rales.  GI: Soft. Bowel sounds  are normal. There is no abdominal tenderness.  Musculoskeletal:     Right ankle: No swelling.     Left ankle: No swelling.  Lymphadenopathy:    She has no cervical adenopathy.  Neurological: She is alert and oriented to person, place, and time. No cranial nerve deficit.  Skin: Skin is warm. No rash noted. Nails show no clubbing.  Psychiatric: She has a normal mood and affect.      Data Reviewed: Basic Metabolic Panel: Recent Labs  Lab 08/12/19 0820 08/13/19 0509 08/14/19 0456 08/15/19 0730  NA 135 139 139 140  K 3.9 3.9 3.9 3.8  CL 103 107 107 109  CO2 20* 22 24 22   GLUCOSE 317* 301* 207* 151*  BUN 16 18 13 14   CREATININE 0.48 0.35* 0.32* 0.37*  CALCIUM 9.5 8.6* 8.5* 8.6*  MG  --   --  2.2  --   PHOS  --   --  3.2  --    Liver Function Tests: Recent Labs  Lab 08/12/19 0820 08/13/19 0509 08/14/19 0456  AST 79* 86*  --   ALT 109* 95*  --   ALKPHOS 174* 110  --   BILITOT 0.7 0.6  --   PROT 8.4* 7.5  --   ALBUMIN 4.3 3.6 3.2*   Recent Labs  Lab 08/12/19 0820  LIPASE 56*  CBC: Recent Labs  Lab 08/12/19 0820 08/12/19 1352 08/13/19 1130 08/13/19 1700 08/13/19 2230 08/14/19 0456 08/15/19 0730  WBC 4.7   < > 9.4 12.1* 12.1* 10.8* 11.1*  NEUTROABS 2.2  --   --   --   --   --   --  HGB 15.3*   < > 13.1 13.2 12.5 12.4 12.6  HCT 42.9   < > 37.3 37.9 36.3 36.5 36.9  MCV 87.9   < > 90.1 91.3 92.1 92.6 91.8  PLT 119*   < > 101* 109* 104* 100* 108*   < > = values in this interval not displayed.   BNP (last 3 results) Recent Labs    03/10/19 1431  BNP 53.0     CBG: Recent Labs  Lab 08/14/19 1740 08/14/19 1956 08/15/19 0423 08/15/19 0749 08/15/19 1208  GLUCAP 170* 118* 221* 149* 183*    Recent Results (from the past 240 hour(s))  Virus culture     Status: None   Collection Time: 08/12/19  9:29 AM   Specimen: Lung; Respiratory  Result Value Ref Range Status   Viral Culture Comment  Final    Comment: (NOTE) Preliminary Report: No virus  isolated at 24 hours. Next report to follow after 4 days. Performed At: [facility] [place], [place] [id number] Rush Farmer MD ME:[id number]    Source of Sample BRONCHIAL WASHINGS  Final    Comment: Performed at [facility], [address] [place], [place] [address]  Respiratory Panel by RT PCR (Flu A&B, Covid) - Nasopharyngeal Swab     Status: None   Collection Time: 08/12/19 10:34 AM   Specimen: Nasopharyngeal Swab  Result Value Ref Range Status   SARS Coronavirus 2 by RT PCR NEGATIVE NEGATIVE Final    Comment: (NOTE) SARS-CoV-2 target nucleic acids are NOT DETECTED. The SARS-CoV-2 RNA is generally detectable in upper respiratoy specimens during the acute phase of infection. The lowest concentration of SARS-CoV-2 viral copies this assay can detect is 131 copies/mL. A negative result does not preclude SARS-Cov-2 infection and should not be used as the sole basis for treatment or other patient management decisions. A negative result may occur with  improper specimen collection/handling, submission of specimen other than nasopharyngeal swab, presence of viral mutation(s) within the areas targeted by this assay, and inadequate number of viral copies (<131 copies/mL). A negative result must be combined with clinical observations, patient history, and epidemiological information. The expected result is Negative. Fact Sheet for Patients:  PinkCheek.be Fact Sheet for Healthcare Providers:  GravelBags.it This test is not yet ap proved or cleared by the [place] FDA and  has been authorized for detection and/or diagnosis of SARS-CoV-2 by FDA under an Emergency Use Authorization (EUA). This EUA will remain  in effect (meaning this test can be used) for the duration of the COVID-19 declaration under Section 564(b)(1) of the Act, 21 U.S.C. section 360bbb-3(b)(1), unless the authorization is  terminated or revoked sooner.    Influenza A by PCR NEGATIVE NEGATIVE Final   Influenza B by PCR NEGATIVE NEGATIVE Final    Comment: (NOTE) The Xpert Xpress SARS-CoV-2/FLU/RSV assay is intended as an aid in  the diagnosis of influenza from Nasopharyngeal swab specimens and  should not be used as a sole basis for treatment. Nasal washings and  aspirates are unacceptable for Xpert Xpress SARS-CoV-2/FLU/RSV  testing. Fact Sheet for Patients: PinkCheek.be Fact Sheet for Healthcare Providers: GravelBags.it This test is not yet approved or cleared by the [place] FDA and  has been authorized for detection and/or diagnosis of SARS-CoV-2 by  FDA under an Emergency Use Authorization (EUA). This EUA will remain  in effect (meaning this test can be used) for the duration of the  Covid-19 declaration under Section 564(b)(1) of the Act,  21  U.S.C. section 360bbb-3(b)(1), unless the authorization is  terminated or revoked. Performed at [facility], [place], [place] [address]   Acid Fast Smear (AFB)     Status: None   Collection Time: 08/12/19  7:10 PM   Specimen: Bronchial Alveolar Lavage; Sputum  Result Value Ref Range Status   AFB Specimen Processing Concentration  Final   Acid Fast Smear Negative  Final    Comment: (NOTE) Performed At: [facility] [place], [place] [id number] Rush Farmer MD YK:[id number]    Source (AFB) BRONCHIAL WASHINGS  Final    Comment: Performed at [facility], [address] [place], [place] [address]  Culture, respiratory     Status: None   Collection Time: 08/12/19  7:10 PM   Specimen: Tracheal Aspirate  Result Value Ref Range Status   Specimen Description   Final    TRACHEAL ASPIRATE Performed at [facility], [address], [place], [place] [address]    Special Requests   Final    NONE Performed at [facility], [address] [place], [place] [address]    Gram Stain NO WBC SEEN NO ORGANISMS SEEN   Final   Culture   Final    NO GROWTH 2 DAYS Performed at [facility], [place] [address], [place], [place] [address]    Report Status 08/15/2019 FINAL  Final     Studies: PERIPHERAL VASCULAR CATHETERIZATION  Result Date: 08/15/2019 See op note   Scheduled Meds: . Chlorhexidine Gluconate Cloth  6 each Topical Daily  . feeding supplement (PRO-STAT SUGAR FREE 64)  30 mL Oral TID BM  . fentaNYL      . fentaNYL      . heparin      . insulin aspart  0-15 Units Subcutaneous Q4H  . methylPREDNISolone (SOLU-MEDROL) injection  40 mg Intravenous Q12H  . midazolam      . midazolam      . promethazine  12.5 mg Intravenous Once  . sodium chloride flush  3 mL Intravenous Q12H   Continuous Infusions: . sodium chloride 100 mL/hr at 08/15/19 1122  . azithromycin Stopped (08/13/19 1837)  . cefTRIAXone (ROCEPHIN)  IV Stopped (08/13/19 1910)  . famotidine (PEPCID) IV 20 mg (08/15/19 1309)    Assessment/Plan:  1. Hemoptysis.  Bronchoscopy done on 08/12/2019.  Patient had angiogram with embolization today by Dr. Delana Meyer.  Still having trace hemoptysis.  Continue to monitor hemoglobin.  Sputum for AFB's ordered. 2. Aspiration pneumonia.  On antibiotics Rocephin and Zithromax.  Try to taper Solu-Medrol. 3. Type 2 diabetes mellitus with hyperglycemia.  Sugars will be high while on steroids.  A1c is elevated at 11.5.  Patient states that she is on Metformin as outpatient will likely start Amaryl also as outpatient.  Would like to see how she does with the diet first. 4. Relative hypotension continue to monitor.  Gentle IV fluids.  Code Status:     Code Status Orders  (From admission, onward)         Start     Ordered   08/12/19 1720  Full code  Continuous     08/12/19 1719        Code Status History    Date Active Date Inactive Code Status Order ID Comments User Context   08/12/2019 1639  08/12/2019 1719 Full Code [id number]  Flora Lipps, MD Inpatient   08/12/2019 1145 08/12/2019 1639 Full Code [id number]  Ivor Costa,  MD ED   Advance Care Planning Activity     Disposition Plan: Continue to monitor hemoglobin and hemoptysis amount.  Want to send a couple more sputum's for AFB.  May need a few more days here in the hospital.  Consultants:  Vascular surgery  Pulmonary  Procedures:  Bronchoscopy 08/12/2019  Angiogram with embolization of the left upper lobe on 08/15/2019  Antibiotics:  Rocephin  Zithromax  Time spent: 28 minutes, Spanish interpreter present  [employer]

## 2019-08-15 NOTE — Progress Notes (Signed)
Follow up - Critical Care Medicine Note  Patient Details:    Vickie Smith is an 54 y.o. female lifelong never smoker, native of [place], presented for presumed hematemesis later found out to be massive hemoptysis (approximately 250 mL) patient was intubated and mechanically ventilated.  Had bronchoscopy 2/2.  Lines, Airways, Drains:    Anti-infectives:  Anti-infectives (From admission, onward)   Start     Dose/Rate Route Frequency Ordered Stop   08/15/19 1230  ceFAZolin (ANCEF) IVPB 2g/100 mL premix  Status:  Discontinued    Note to Pharmacy: To be given in specials   2 g 200 mL/hr over 30 Minutes Intravenous  Once 08/15/19 1223 08/15/19 1232   08/15/19 0600  ceFAZolin (ANCEF) IVPB 2g/100 mL premix  Status:  Discontinued    Note to Pharmacy: To be given in specials   2 g 200 mL/hr over 30 Minutes Intravenous  Once 08/14/19 1639 08/15/19 1048   08/13/19 1800  cefTRIAXone (ROCEPHIN) 2 g in sodium chloride 0.9 % 100 mL IVPB     2 g 200 mL/hr over 30 Minutes Intravenous Every 24 hours 08/13/19 0111 08/17/19 1759   08/12/19 2200  cefTRIAXone (ROCEPHIN) 2 g in sodium chloride 0.9 % 100 mL IVPB  Status:  Discontinued     2 g 200 mL/hr over 30 Minutes Intravenous Every 12 hours 08/12/19 1821 08/13/19 0111   08/12/19 2000  azithromycin (ZITHROMAX) 500 mg in sodium chloride 0.9 % 250 mL IVPB     500 mg 250 mL/hr over 60 Minutes Intravenous Every 24 hours 08/12/19 1821 08/17/19 1759   08/12/19 1045  cefTRIAXone (ROCEPHIN) 1 g in sodium chloride 0.9 % 100 mL IVPB  Status:  Discontinued     1 g 200 mL/hr over 30 Minutes Intravenous  Once 08/12/19 1044 08/12/19 1556      Microbiology: Results for orders placed or performed during the hospital encounter of 08/12/19  Virus culture     Status: None   Collection Time: 08/12/19  9:29 AM   Specimen: Lung; Respiratory  Result Value Ref Range Status   Viral Culture Comment  Final    Comment: (NOTE) Preliminary Report: No virus isolated at 24  hours. Next report to follow after 4 days. Performed At: [facility] [place], [place] [id number] Rush Farmer MD IW:[id number]    Source of Sample BRONCHIAL WASHINGS  Final    Comment: Performed at [facility], [address] [place], [place] [address]  Respiratory Panel by RT PCR (Flu A&B, Covid) - Nasopharyngeal Swab     Status: None   Collection Time: 08/12/19 10:34 AM   Specimen: Nasopharyngeal Swab  Result Value Ref Range Status   SARS Coronavirus 2 by RT PCR NEGATIVE NEGATIVE Final    Comment: (NOTE) SARS-CoV-2 target nucleic acids are NOT DETECTED. The SARS-CoV-2 RNA is generally detectable in upper respiratoy specimens during the acute phase of infection. The lowest concentration of SARS-CoV-2 viral copies this assay can detect is 131 copies/mL. A negative result does not preclude SARS-Cov-2 infection and should not be used as the sole basis for treatment or other patient management decisions. A negative result may occur with  improper specimen collection/handling, submission of specimen other than nasopharyngeal swab, presence of viral mutation(s) within the areas targeted by this assay, and inadequate number of viral copies (<131 copies/mL). A negative result must be combined with clinical observations, patient history, and epidemiological information. The expected result is Negative. Fact Sheet for Patients:  PinkCheek.be  Fact Sheet for Healthcare Providers:  GravelBags.it This test is not yet ap proved or cleared by the [place] FDA and  has been authorized for detection and/or diagnosis of SARS-CoV-2 by FDA under an Emergency Use Authorization (EUA). This EUA will remain  in effect (meaning this test can be used) for the duration of the COVID-19 declaration under Section 564(b)(1) of the Act, 21 U.S.C. section 360bbb-3(b)(1), unless the authorization is terminated  or revoked sooner.    Influenza A by PCR NEGATIVE NEGATIVE Final   Influenza B by PCR NEGATIVE NEGATIVE Final    Comment: (NOTE) The Xpert Xpress SARS-CoV-2/FLU/RSV assay is intended as an aid in  the diagnosis of influenza from Nasopharyngeal swab specimens and  should not be used as a sole basis for treatment. Nasal washings and  aspirates are unacceptable for Xpert Xpress SARS-CoV-2/FLU/RSV  testing. Fact Sheet for Patients: PinkCheek.be Fact Sheet for Healthcare Providers: GravelBags.it This test is not yet approved or cleared by the [place] FDA and  has been authorized for detection and/or diagnosis of SARS-CoV-2 by  FDA under an Emergency Use Authorization (EUA). This EUA will remain  in effect (meaning this test can be used) for the duration of the  Covid-19 declaration under Section 564(b)(1) of the Act, 21  U.S.C. section 360bbb-3(b)(1), unless the authorization is  terminated or revoked. Performed at [facility], [place], [place] [address]   Acid Fast Smear (AFB)     Status: None   Collection Time: 08/12/19  7:10 PM   Specimen: Bronchial Alveolar Lavage; Sputum  Result Value Ref Range Status   AFB Specimen Processing Concentration  Final   Acid Fast Smear Negative  Final    Comment: (NOTE) Performed At: [facility] [place], [place] [id number] Rush Farmer MD SE:[id number]    Source (AFB) BRONCHIAL WASHINGS  Final    Comment: Performed at [facility], [address] [place], [place] [address]  Culture, respiratory     Status: None   Collection Time: 08/12/19  7:10 PM   Specimen: Tracheal Aspirate  Result Value Ref Range Status   Specimen Description   Final    TRACHEAL ASPIRATE Performed at [facility], [address], [place], [place] [address]    Special Requests   Final    NONE Performed at [facility], [address] [place], [place] [address]    Gram Stain NO WBC SEEN NO ORGANISMS SEEN   Final   Culture   Final    NO GROWTH 2 DAYS Performed at [facility], [place] [address] [place],  [address]    Report Status 08/15/2019 FINAL  Final    Best Practice/Protocols:  VTE Prophylaxis: Mechanical GI Prophylaxis: Antihistamine   Events: 2/02 admitted for presumed hematemesis, massive hemoptysis noted, intubated 2/03 passes SAT/SBT, no further hemoptysis noted, extubated 2/04 extubated yesterday, no sequela 2/5 - s/p pulmonary artery embolization  Studies: CT ANGIO CHEST PE W OR WO CONTRAST  Result Date: 08/12/2019 CLINICAL DATA:  Hemoptysis. EXAM: CT ANGIOGRAPHY CHEST WITH CONTRAST TECHNIQUE: Multidetector CT imaging of the chest was performed using the standard protocol during bolus administration of intravenous contrast. Multiplanar CT image reconstructions and MIPs were obtained to evaluate the vascular anatomy. CONTRAST:  70m OMNIPAQUE IOHEXOL 350 MG/ML SOLN COMPARISON:  None. FINDINGS: Cardiovascular: Satisfactory opacification of the pulmonary arteries to the segmental level. No evidence of pulmonary embolism. Normal heart size. No pericardial effusion. Mediastinum/Nodes: Endotracheal tube  is in grossly good position. Aspirated material is noted in the distal trachea and bilateral bronchi. No adenopathy is noted. Thyroid gland is unremarkable. Lungs/Pleura: No pneumothorax or pleural effusion is noted. Bilateral posterior basilar opacities are noted concerning for atelectasis or aspiration pneumonia. Left upper lobe opacity is also noted concerning for pneumonia. Upper Abdomen: No acute abnormality. Musculoskeletal: No chest wall abnormality. No acute or significant osseous findings. Review of the MIP images confirms the above findings. IMPRESSION: 1. No definite evidence of pulmonary embolus. 2. Aspirated material is noted in the distal trachea and bilateral bronchi. 3. Bilateral  posterior basilar opacities are noted concerning for atelectasis or aspiration pneumonia. Left upper lobe opacity is also noted concerning for pneumonia. Electronically Signed   By: Marijo Conception M.D.   On: 08/12/2019 18:40   PERIPHERAL VASCULAR CATHETERIZATION  Result Date: 08/15/2019 See op note  DG Chest Port 1 View  Result Date: 08/13/2019 CLINICAL DATA:  Endotracheal tube. EXAM: PORTABLE CHEST 1 VIEW COMPARISON:  March 10, 2019. FINDINGS: The heart size and mediastinal contours are within normal limits. Endotracheal tube is seen in grossly good position. No pneumothorax or pleural effusion is noted. Both lungs are clear. The visualized skeletal structures are unremarkable. IMPRESSION: Endotracheal tube in grossly good position. No acute cardiopulmonary abnormality seen. Electronically Signed   By: Marijo Conception M.D.   On: 08/13/2019 08:53   US LIVER DOPPLER  Result Date: 08/12/2019 CLINICAL DATA:  Elevated LFTs EXAM: DUPLEX ULTRASOUND OF LIVER TECHNIQUE: Color and duplex Doppler ultrasound was performed to evaluate the hepatic in-flow and out-flow vessels. COMPARISON:  None. FINDINGS: Main Portal Vein size: 1 cm Portal Vein Velocities (all hepatopetal): Main Prox:  27 cm/sec Main Mid: 25 cm/sec Main Dist:  22 cm/sec Right: 15 cm/sec Left: 15 cm/sec Hepatic Vein Velocities (all hepatofugal): Right:  23 cm/sec Middle:  18 cm/sec Left:  25 cm/sec IVC: Present and patent with normal respiratory phasicity. Velocity 76 cm/sec. Hepatic Artery Velocity:  55 cm/sec Splenic Vein Velocity:  14 cm/sec Spleen: 8.7 cm x 10.9 cm x 4.2 cm with a total volume of 209 cm^3 (411 cm^3 is upper limit normal) Portal Vein Occlusion/Thrombus: No Splenic Vein Occlusion/Thrombus: No Ascites: None Varices: None IMPRESSION: 1. Unremarkable hepatic vascular Doppler evaluation. Electronically Signed   By: Lucrezia Europe M.D.   On: 08/12/2019 13:19   US ABDOMEN LIMITED RUQ  Addendum Date: 08/12/2019   ADDENDUM REPORT: 08/12/2019  17:45 ADDENDUM: These results will be called to the ordering clinician or representative by the Radiologist Assistant, and communication documented in the PACS or zVision Dashboard. Electronically Signed   By: Zetta Bills M.D.   On: 08/12/2019 17:45   Result Date: 08/12/2019 CLINICAL DATA:  Elevated liver enzymes vomiting for 1 day EXAM: ULTRASOUND ABDOMEN LIMITED RIGHT UPPER QUADRANT COMPARISON:  None. FINDINGS: Gallbladder: No gallstones or wall thickening visualized. No sonographic Murphy sign noted by sonographer. Common bile duct: Diameter: 4 mm Liver: Moderately heterogeneous hepatic echotexture, suggestion of physeal widening and nodular contour. Area of variable hepatic echogenicity measuring approximately 3.5 x 3.6 cm in the anterior right hepatic lobe (image 33 of 35. Also suggested on other images along the medial aspect of the right hemi liver, perhaps in subsegment V. on some images geographic increased echogenicity is displayed. Portal vein is patent on color Doppler imaging with normal direction of blood flow towards the liver. Other: No ascites. IMPRESSION: 1. Signs of cirrhosis with possible focal hepatic lesion versus is area of fatty infiltration.  Multiphase imaging with CT or MR is suggested on follow-up. 2. No signs of cholecystitis or biliary calculi. Electronically Signed: By: Zetta Bills M.D. On: 08/12/2019 12:19    Consults: Treatment Team:  Katha Cabal, MD   Subjective:    Overnight Issues: No overnight issues.   S/p pulm aa embolization Objective:  Vital signs for last 24 hours: Temp:  [98.2 F (36.8 C)-98.4 F (36.9 C)] 98.2 F (36.8 C) (02/05 1600) Pulse Rate:  [51-81] 81 (02/05 1700) Resp:  [10-26] 14 (02/05 1700) BP: (89-133)/(30-95) 104/75 (02/05 1600) SpO2:  [92 %-100 %] 100 % (02/05 1700) Weight:  [54.5 kg-56.6 kg] 54.5 kg (02/05 0910)  Hemodynamic parameters for last 24 hours:    Intake/Output from previous day: 02/04 0701 - 02/05 0700 In:  1937.5 [P.O.:270; I.V.:1567.5; IV Piggyback:100] Out: 1300 [Urine:1250; Emesis/NG output:50]  Intake/Output this shift: Total I/O In: 740 [P.O.:740] Out: 1650 [Urine:1650]  Vent settings for last 24 hours:    Physical Exam:  GENERAL: Awake alert, no distress.   HEAD: Normocephalic, atraumatic.  EYES: Pupils equal, round, reactive to light.  No scleral icterus.  MOUTH: Oral mucosa moist, no thrush. NECK: Supple. No JVD.  Trachea midline. PULMONARY: Clear lung sounds, no adventitious sounds. CARDIOVASCULAR: S1 and S2. Regular rate and rhythm. No murmurs, rubs, or gallops.  GASTROINTESTINAL: Soft, non-distended.   MUSCULOSKELETAL: No joint swelling, no clubbing, no edema.  NEUROLOGIC: , follows commands. SKIN:intact,warm,dry  Assessment/Plan:  Acute hypoxic respiratory failure due to massive hemoptysis Due to diffuse alveolar hemorrhage -Status post pulmonary artery embolization-appreciate vascular surgery Continue pulmonary toilet   Modest thrombocytopenia Note that patient has cirrhosis Will have hematology assist with work-up Doubt cause of hemoptysis but clearly did not help issue  Hepatic cirrhosis Abnormal LFTs No history of alcohol consumption No familial history per patient Connective tissue disease work-up See orders for details In the presence of cirrhosis  Diabetes mellitus ICU hyperglycemia protocol  Prophylaxis: Famotidine, SCDs avoid chemical VTE prophylaxis due to massive hemoptysis    LOS: 3 days    Critical care provider statement:    Critical care time (minutes):  33   Critical care time was exclusive of:  Separately billable procedures and  treating other patients   Critical care was necessary to treat or prevent imminent or  life-threatening deterioration of the following conditions:   Acute hypoxemic respiratory failure, COVID-19 pneumonitis, acute blood loss anemia, hemoptysis, status post pulmonary artery embolization, multiple comorbid  conditions   Critical care was time spent personally by me on the following  activities:  Development of treatment plan with patient or surrogate,  discussions with consultants, evaluation of patient's response to  treatment, examination of patient, obtaining history from patient or  surrogate, ordering and performing treatments and interventions, ordering  and review of laboratory studies and re-evaluation of patient's condition   I assumed direction of critical care for this patient from another  provider in my specialty: no     *Care during the described time interval was provided by me and/or other providers on the critical care team.  I have reviewed this patient's available data, including medical history, events of note, physical examination and test results as part of my evaluation.  **This note was dictated using voice recognition software/Dragon.  Despite best efforts to proofread, errors can occur which can change the meaning.  Any change was purely unintentional.    Ottie Glazier, M.D.  [facility] [place]

## 2019-08-15 NOTE — Progress Notes (Signed)
Inpatient Diabetes Program Recommendations  AACE/ADA: New Consensus Statement on Inpatient Glycemic Control (2015)  Target Ranges:  Prepandial:   less than 140 mg/dL      Peak postprandial:   less than 180 mg/dL (1-2 hours)      Critically ill patients:  140 - 180 mg/dL   Results for MAILY, DEBARGE (MRN [id number]) as of 08/15/2019 10:53  Ref. Range 08/14/2019 15:39 08/14/2019 17:40 08/14/2019 19:56 08/15/2019 04:23 08/15/2019 07:49  Glucose-Capillary Latest Ref Range: 70 - 99 mg/dL 200 (H) 170 (H)  3 units NOVOLOG  118 (H) 221 (H)  5 units NOVOLOG  149 (H)  2 units NOVOLOG    Results for STOREY, STANGELAND (MRN [id number]) as of 08/15/2019 10:53  Ref. Range 08/12/2019 08:28  Hemoglobin A1C Latest Ref Range: 4.8 - 5.6 % 11.5 (H)  (283 mg/dl)    Admit with: Hemoptysis/ Hematememis/ Resp Failure  History: Diabetes 2  Home DM Meds: None--Has taken Metformin in the past per record Review  Current Orders: Novolog Moderate Correction Scale/ SSI (0-15 units) Q4 hours     Getting Solumedrol 40 mg BID.  NPO today for Pulm Angiogram with possible intervention.   MD- Note A1c elevated to 11.5% this admission.  Doesn't look like pt was taking any diabetes meds prior to admission.  Will at least need oral Diabetes meds at time of discharge.  Renal function WNL so we could try Metformin + Amaryl at time of d/c.     --Will follow patient during hospitalization--  Wyn Quaker RN, MSN, CDE Diabetes Coordinator Inpatient Glycemic Control Team Team Pager: [phone] (8a-5p)

## 2019-08-15 NOTE — Progress Notes (Signed)
Assisted tele visit to patient with sister, Oris Drone.  Maryelizabeth Rowan, RN

## 2019-08-15 NOTE — Progress Notes (Signed)
Pt with 2 episodes of hemoptysis during nightshift.  Dr. Delana Meyer aware and pt is on vasculars schedule for a pulmonary angiogram with possible intervention this morning.  Will continue to monitor and assess pt.  Marda Stalker, [place] Pager [phone] (please enter 7 digits) PCCM Consult Pager [phone] (please enter 7 digits)

## 2019-08-16 DIAGNOSIS — J029 Acute pharyngitis, unspecified: Secondary | ICD-10-CM

## 2019-08-16 LAB — CBC
HCT: 35.1 % — ABNORMAL LOW (ref 36.0–46.0)
Hemoglobin: 12.3 g/dL (ref 12.0–15.0)
MCH: 31.4 pg (ref 26.0–34.0)
MCHC: 35 g/dL (ref 30.0–36.0)
MCV: 89.5 fL (ref 80.0–100.0)
Platelets: 117 10*3/uL — ABNORMAL LOW (ref 150–400)
RBC: 3.92 MIL/uL (ref 3.87–5.11)
RDW: 12.2 % (ref 11.5–15.5)
WBC: 9 10*3/uL (ref 4.0–10.5)
nRBC: 0 % (ref 0.0–0.2)

## 2019-08-16 LAB — BASIC METABOLIC PANEL
Anion gap: 6 (ref 5–15)
BUN: 12 mg/dL (ref 6–20)
CO2: 27 mmol/L (ref 22–32)
Calcium: 8.5 mg/dL — ABNORMAL LOW (ref 8.9–10.3)
Chloride: 107 mmol/L (ref 98–111)
Creatinine, Ser: 0.38 mg/dL — ABNORMAL LOW (ref 0.44–1.00)
GFR calc Af Amer: >60 mL/min (ref >60)
GFR calc non Af Amer: >60 mL/min (ref >60)
Glucose, Bld: 130 mg/dL — ABNORMAL HIGH (ref 70–99)
Potassium: 3.2 mmol/L — ABNORMAL LOW (ref 3.5–5.1)
Sodium: 140 mmol/L (ref 135–145)

## 2019-08-16 LAB — GLUCOSE, CAPILLARY
Glucose-Capillary: 115 mg/dL — ABNORMAL HIGH (ref 70–99)
Glucose-Capillary: 149 mg/dL — ABNORMAL HIGH (ref 70–99)
Glucose-Capillary: 375 mg/dL — ABNORMAL HIGH (ref 70–99)
Glucose-Capillary: 317 mg/dL — ABNORMAL HIGH (ref 70–99)
Glucose-Capillary: 263 mg/dL — ABNORMAL HIGH (ref 70–99)

## 2019-08-16 MED ORDER — INSULIN GLARGINE 100 UNIT/ML ~~LOC~~ SOLN
6.0000 [IU] | Freq: Every day | SUBCUTANEOUS | Status: DC
  Administered 2019-08-16: 22:00:00 6 [IU] via SUBCUTANEOUS
  Filled 2019-08-16 (×2): qty 0.06

## 2019-08-16 MED ORDER — INSULIN ASPART 100 UNIT/ML ~~LOC~~ SOLN
0.0000 [IU] | SUBCUTANEOUS | Status: DC
  Administered 2019-08-16: 5 [IU] via SUBCUTANEOUS
  Administered 2019-08-17: 05:00:00 1 [IU] via SUBCUTANEOUS
  Administered 2019-08-17: 09:00:00 2 [IU] via SUBCUTANEOUS
  Administered 2019-08-17: 01:00:00 3 [IU] via SUBCUTANEOUS
  Filled 2019-08-16 (×4): qty 1

## 2019-08-16 MED ORDER — POTASSIUM CHLORIDE CRYS ER 20 MEQ PO TBCR
40.0000 meq | EXTENDED_RELEASE_TABLET | Freq: Once | ORAL | Status: AC
  Administered 2019-08-16: 09:00:00 40 meq via ORAL
  Filled 2019-08-16: qty 2

## 2019-08-16 MED ORDER — INSULIN ASPART 100 UNIT/ML ~~LOC~~ SOLN
0.0000 [IU] | SUBCUTANEOUS | Status: DC
  Administered 2019-08-16: 4 [IU] via SUBCUTANEOUS

## 2019-08-16 MED ORDER — PHENOL 1.4 % MT LIQD
1.0000 | OROMUCOSAL | Status: DC | PRN
  Filled 2019-08-16: qty 177

## 2019-08-16 MED ORDER — ALUM & MAG HYDROXIDE-SIMETH 200-200-20 MG/5ML PO SUSP
30.0000 mL | Freq: Three times a day (TID) | ORAL | Status: DC | PRN
  Administered 2019-08-16 (×2): 30 mL via ORAL
  Filled 2019-08-16 (×2): qty 30

## 2019-08-16 MED ORDER — MENTHOL 3 MG MT LOZG
1.0000 | LOZENGE | OROMUCOSAL | Status: DC | PRN
  Filled 2019-08-16 (×2): qty 9

## 2019-08-16 MED ORDER — LIDOCAINE VISCOUS HCL 2 % MT SOLN
15.0000 mL | Freq: Three times a day (TID) | OROMUCOSAL | Status: DC | PRN
  Administered 2019-08-16: 20:00:00 15 mL via ORAL
  Filled 2019-08-16: qty 15

## 2019-08-16 MED ORDER — LIDOCAINE VISCOUS HCL 2 % MT SOLN: 15.0000 mL | Freq: Once | OROMUCOSAL | Status: DC

## 2019-08-16 MED ORDER — HYDROCOD POLST-CPM POLST ER 10-8 MG/5ML PO SUER
5.0000 mL | Freq: Three times a day (TID) | ORAL | Status: DC | PRN
  Administered 2019-08-16: 5 mL via ORAL
  Filled 2019-08-16: qty 5

## 2019-08-16 MED ORDER — POTASSIUM CHLORIDE CRYS ER 20 MEQ PO TBCR
40.0000 meq | EXTENDED_RELEASE_TABLET | Freq: Once | ORAL | Status: DC
  Filled 2019-08-16: qty 2

## 2019-08-16 MED ORDER — HYDROCOD POLST-CPM POLST ER 10-8 MG/5ML PO SUER: 10.0000 mL | Freq: Three times a day (TID) | ORAL | Status: DC | PRN

## 2019-08-16 MED ORDER — ALUM & MAG HYDROXIDE-SIMETH 200-200-20 MG/5ML PO SUSP: 30.0000 mL | Freq: Three times a day (TID) | ORAL | Status: DC | PRN

## 2019-08-16 NOTE — Progress Notes (Signed)
Follow up - Critical Care Medicine Note  Patient Details:    Vickie Smith is an 54 y.o. female lifelong never smoker, native of [place], presented for presumed hematemesis later found out to be massive hemoptysis (approximately 250 mL) patient was intubated and mechanically ventilated.  Had bronchoscopy 2/2.  Lines, Airways, Drains:    Anti-infectives:  Anti-infectives (From admission, onward)   Start     Dose/Rate Route Frequency Ordered Stop   08/15/19 1230  ceFAZolin (ANCEF) IVPB 2g/100 mL premix  Status:  Discontinued    Note to Pharmacy: To be given in specials   2 g 200 mL/hr over 30 Minutes Intravenous  Once 08/15/19 1223 08/15/19 1232   08/15/19 0600  ceFAZolin (ANCEF) IVPB 2g/100 mL premix  Status:  Discontinued    Note to Pharmacy: To be given in specials   2 g 200 mL/hr over 30 Minutes Intravenous  Once 08/14/19 1639 08/15/19 1048   08/13/19 1800  cefTRIAXone (ROCEPHIN) 2 g in sodium chloride 0.9 % 100 mL IVPB     2 g 200 mL/hr over 30 Minutes Intravenous Every 24 hours 08/13/19 0111 08/17/19 1759   08/12/19 2200  cefTRIAXone (ROCEPHIN) 2 g in sodium chloride 0.9 % 100 mL IVPB  Status:  Discontinued     2 g 200 mL/hr over 30 Minutes Intravenous Every 12 hours 08/12/19 1821 08/13/19 0111   08/12/19 2000  azithromycin (ZITHROMAX) 500 mg in sodium chloride 0.9 % 250 mL IVPB     500 mg 250 mL/hr over 60 Minutes Intravenous Every 24 hours 08/12/19 1821 08/17/19 1759   08/12/19 1045  cefTRIAXone (ROCEPHIN) 1 g in sodium chloride 0.9 % 100 mL IVPB  Status:  Discontinued     1 g 200 mL/hr over 30 Minutes Intravenous  Once 08/12/19 1044 08/12/19 1556      Microbiology: Results for orders placed or performed during the hospital encounter of 08/12/19  Virus culture     Status: None   Collection Time: 08/12/19  9:29 AM   Specimen: Lung; Respiratory  Result Value Ref Range Status   Viral Culture Comment  Final    Comment: (NOTE) Preliminary Report: No virus isolated at 24  hours. Next report to follow after 4 days. Performed At: [facility] [place], [place] [id number] Rush Farmer MD IZ:[id number]    Source of Sample BRONCHIAL WASHINGS  Final    Comment: Performed at [facility], [address] [place], [place] [address]  Respiratory Panel by RT PCR (Flu A&B, Covid) - Nasopharyngeal Swab     Status: None   Collection Time: 08/12/19 10:34 AM   Specimen: Nasopharyngeal Swab  Result Value Ref Range Status   SARS Coronavirus 2 by RT PCR NEGATIVE NEGATIVE Final    Comment: (NOTE) SARS-CoV-2 target nucleic acids are NOT DETECTED. The SARS-CoV-2 RNA is generally detectable in upper respiratoy specimens during the acute phase of infection. The lowest concentration of SARS-CoV-2 viral copies this assay can detect is 131 copies/mL. A negative result does not preclude SARS-Cov-2 infection and should not be used as the sole basis for treatment or other patient management decisions. A negative result may occur with  improper specimen collection/handling, submission of specimen other than nasopharyngeal swab, presence of viral mutation(s) within the areas targeted by this assay, and inadequate number of viral copies (<131 copies/mL). A negative result must be combined with clinical observations, patient history, and epidemiological information. The expected result is Negative. Fact Sheet for Patients:  PinkCheek.be  Fact Sheet for Healthcare Providers:  GravelBags.it This test is not yet ap proved or cleared by the [place] FDA and  has been authorized for detection and/or diagnosis of SARS-CoV-2 by FDA under an Emergency Use Authorization (EUA). This EUA will remain  in effect (meaning this test can be used) for the duration of the COVID-19 declaration under Section 564(b)(1) of the Act, 21 U.S.C. section 360bbb-3(b)(1), unless the authorization is terminated  or revoked sooner.    Influenza A by PCR NEGATIVE NEGATIVE Final   Influenza B by PCR NEGATIVE NEGATIVE Final    Comment: (NOTE) The Xpert Xpress SARS-CoV-2/FLU/RSV assay is intended as an aid in  the diagnosis of influenza from Nasopharyngeal swab specimens and  should not be used as a sole basis for treatment. Nasal washings and  aspirates are unacceptable for Xpert Xpress SARS-CoV-2/FLU/RSV  testing. Fact Sheet for Patients: PinkCheek.be Fact Sheet for Healthcare Providers: GravelBags.it This test is not yet approved or cleared by the [place] FDA and  has been authorized for detection and/or diagnosis of SARS-CoV-2 by  FDA under an Emergency Use Authorization (EUA). This EUA will remain  in effect (meaning this test can be used) for the duration of the  Covid-19 declaration under Section 564(b)(1) of the Act, 21  U.S.C. section 360bbb-3(b)(1), unless the authorization is  terminated or revoked. Performed at [facility] [place], [place], [place] [address]   Acid Fast Smear (AFB)     Status: None   Collection Time: 08/12/19  7:10 PM   Specimen: Bronchial Alveolar Lavage; Sputum  Result Value Ref Range Status   AFB Specimen Processing Concentration  Final   Acid Fast Smear Negative  Final    Comment: (NOTE) Performed At: [facility] [place], [place] [id number] Rush Farmer MD BS:[id number]    Source (AFB) BRONCHIAL WASHINGS  Final    Comment: Performed at [facility], [address] [place], [place] [address]  Culture, respiratory     Status: None   Collection Time: 08/12/19  7:10 PM   Specimen: Tracheal Aspirate  Result Value Ref Range Status   Specimen Description   Final    TRACHEAL ASPIRATE Performed at [facility], [address] [place], [place] [address]    Special Requests   Final    NONE Performed at [facility], [address] [place],  [address]    Gram Stain NO WBC SEEN NO ORGANISMS SEEN   Final   Culture   Final    NO GROWTH 2 DAYS Performed at [facility], [address] [place],  [address]    Report Status 08/15/2019 FINAL  Final    Best Practice/Protocols:  VTE Prophylaxis: Mechanical GI Prophylaxis: Antihistamine   Events: 2/02 admitted for presumed hematemesis, massive hemoptysis noted, intubated 2/03 passes SAT/SBT, no further hemoptysis noted, extubated 2/04 extubated yesterday, no sequela 2/5 - s/p pulmonary artery embolization 2/6 - patient seems improved, she had epigastric pain this morning, we did give famotidine as well as mylanta/lidocaine for GERD which resolved pain.  She is with minimal cough during examination today.  Discussed case with Dr Earleen Newport plan for possible move to floor  Studies: CT ANGIO CHEST PE W OR WO CONTRAST  Result Date: 08/12/2019 CLINICAL DATA:  Hemoptysis. EXAM: CT ANGIOGRAPHY CHEST WITH CONTRAST TECHNIQUE: Multidetector CT imaging of the chest was performed using the standard protocol during bolus administration of intravenous contrast. Multiplanar CT image reconstructions and  MIPs were obtained to evaluate the vascular anatomy. CONTRAST:  23m OMNIPAQUE IOHEXOL 350 MG/ML SOLN COMPARISON:  None. FINDINGS: Cardiovascular: Satisfactory opacification of the pulmonary arteries to the segmental level. No evidence of pulmonary embolism. Normal heart size. No pericardial effusion. Mediastinum/Nodes: Endotracheal tube is in grossly good position. Aspirated material is noted in the distal trachea and bilateral bronchi. No adenopathy is noted. Thyroid gland is unremarkable. Lungs/Pleura: No pneumothorax or pleural effusion is noted. Bilateral posterior basilar opacities are noted concerning for atelectasis or aspiration pneumonia. Left upper lobe opacity is also noted concerning for pneumonia. Upper Abdomen: No acute abnormality. Musculoskeletal:  No chest wall abnormality. No acute or significant osseous findings. Review of the MIP images confirms the above findings. IMPRESSION: 1. No definite evidence of pulmonary embolus. 2. Aspirated material is noted in the distal trachea and bilateral bronchi. 3. Bilateral posterior basilar opacities are noted concerning for atelectasis or aspiration pneumonia. Left upper lobe opacity is also noted concerning for pneumonia. Electronically Signed   By: JMarijo ConceptionM.D.   On: 08/12/2019 18:40   PERIPHERAL VASCULAR CATHETERIZATION  Result Date: 08/15/2019 See op note  DG Chest Port 1 View  Result Date: 08/13/2019 CLINICAL DATA:  Endotracheal tube. EXAM: PORTABLE CHEST 1 VIEW COMPARISON:  March 10, 2019. FINDINGS: The heart size and mediastinal contours are within normal limits. Endotracheal tube is seen in grossly good position. No pneumothorax or pleural effusion is noted. Both lungs are clear. The visualized skeletal structures are unremarkable. IMPRESSION: Endotracheal tube in grossly good position. No acute cardiopulmonary abnormality seen. Electronically Signed   By: JMarijo ConceptionM.D.   On: 08/13/2019 08:53   [place] DOPPLER  Result Date: 08/12/2019 CLINICAL DATA:  Elevated LFTs EXAM: DUPLEX ULTRASOUND OF LIVER TECHNIQUE: Color and duplex Doppler ultrasound was performed to evaluate the hepatic in-flow and out-flow vessels. COMPARISON:  None. FINDINGS: Main Portal Vein size: 1 cm Portal Vein Velocities (all hepatopetal): Main Prox:  27 cm/sec Main Mid: 25 cm/sec Main Dist:  22 cm/sec Right: 15 cm/sec Left: 15 cm/sec Hepatic Vein Velocities (all hepatofugal): Right:  23 cm/sec Middle:  18 cm/sec Left:  25 cm/sec IVC: Present and patent with normal respiratory phasicity. Velocity 76 cm/sec. Hepatic Artery Velocity:  55 cm/sec Splenic Vein Velocity:  14 cm/sec Spleen: 8.7 cm x 10.9 cm x 4.2 cm with a total volume of 209 cm^3 (411 cm^3 is upper limit normal) Portal Vein Occlusion/Thrombus: No Splenic  Vein Occlusion/Thrombus: No Ascites: None Varices: None IMPRESSION: 1. Unremarkable hepatic vascular Doppler evaluation. Electronically Signed   By: DLucrezia EuropeM.D.   On: 08/12/2019 13:19   [place] LIMITED RUQ  Addendum Date: 08/12/2019   ADDENDUM REPORT: 08/12/2019 17:45 ADDENDUM: These results will be called to the ordering clinician or representative by the Radiologist Assistant, and communication documented in the PACS or zVision Dashboard. Electronically Signed   By: GZetta BillsM.D.   On: 08/12/2019 17:45   Result Date: 08/12/2019 CLINICAL DATA:  Elevated liver enzymes vomiting for 1 day EXAM: ULTRASOUND ABDOMEN LIMITED RIGHT UPPER QUADRANT COMPARISON:  None. FINDINGS: Gallbladder: No gallstones or wall thickening visualized. No sonographic Murphy sign noted by sonographer. Common bile duct: Diameter: 4 mm Liver: Moderately heterogeneous hepatic echotexture, suggestion of physeal widening and nodular contour. Area of variable hepatic echogenicity measuring approximately 3.5 x 3.6 cm in the anterior right hepatic lobe (image 33 of 35. Also suggested on other images along the medial aspect of the right hemi liver, perhaps in subsegment  V. on some images geographic increased echogenicity is displayed. Portal vein is patent on color Doppler imaging with normal direction of blood flow towards the liver. Other: No ascites. IMPRESSION: 1. Signs of cirrhosis with possible focal hepatic lesion versus is area of fatty infiltration. Multiphase imaging with CT or MR is suggested on follow-up. 2. No signs of cholecystitis or biliary calculi. Electronically Signed: By: Zetta Bills M.D. On: 08/12/2019 12:19    Consults: Treatment Team:  Katha Cabal, MD   Subjective:    Overnight Issues: No overnight issues.   S/p pulm aa embolization Objective:  Vital signs for last 24 hours: Temp:  [98.2 F (36.8 C)-98.7 F (37.1 C)] 98.2 F (36.8 C) (02/06 0743) Pulse Rate:  [56-89] 68 (02/06  1500) Resp:  [11-21] 12 (02/06 1500) BP: (94-119)/(63-78) 105/72 (02/06 1200) SpO2:  [96 %-100 %] 97 % (02/06 1500) Weight:  [54 kg] 54 kg (02/06 0313)  Hemodynamic parameters for last 24 hours:    Intake/Output from previous day: 02/05 0701 - 02/06 0700 In: 2247.3 [P.O.:740; I.V.:1057.2; IV Piggyback:450.1] Out: 2650 [Urine:2650]  Intake/Output this shift: Total I/O In: 197.2 [I.V.:147.2; IV Piggyback:50] Out: -   Vent settings for last 24 hours:    Physical Exam:  GENERAL: Awake alert, no distress.   HEAD: Normocephalic, atraumatic.  EYES: Pupils equal, round, reactive to light.  No scleral icterus.  MOUTH: Oral mucosa moist, no thrush. NECK: Supple. No JVD.  Trachea midline. PULMONARY: Clear lung sounds, no adventitious sounds. CARDIOVASCULAR: S1 and S2. Regular rate and rhythm. No murmurs, rubs, or gallops.  GASTROINTESTINAL: Soft, non-distended.   MUSCULOSKELETAL: No joint swelling, no clubbing, no edema.  NEUROLOGIC: , follows commands. SKIN:intact,warm,dry  Assessment/Plan:  Acute hypoxic respiratory failure due to massive hemoptysis Due to diffuse alveolar hemorrhage -Status post pulmonary artery embolization-appreciate vascular surgery Continue pulmonary toilet   Modest thrombocytopenia Note that patient has cirrhosis Will have hematology assist with work-up Doubt cause of hemoptysis but clearly did not help issue  Hepatic cirrhosis Abnormal LFTs No history of alcohol consumption No familial history per patient Connective tissue disease work-up See orders for details In the presence of cirrhosis  Diabetes mellitus ICU hyperglycemia protocol  Prophylaxis: Famotidine, SCDs avoid chemical VTE prophylaxis due to massive hemoptysis    LOS: 4 days    Critical care provider statement:    Critical care time (minutes):  33   Critical care time was exclusive of:  Separately billable procedures and  treating other patients   Critical care was necessary  to treat or prevent imminent or  life-threatening deterioration of the following conditions:   Acute hypoxemic respiratory failure, COVID-19 pneumonitis, acute blood loss anemia, hemoptysis, status post pulmonary artery embolization, multiple comorbid conditions   Critical care was time spent personally by me on the following  activities:  Development of treatment plan with patient or surrogate,  discussions with consultants, evaluation of patient's response to  treatment, examination of patient, obtaining history from patient or  surrogate, ordering and performing treatments and interventions, ordering  and review of laboratory studies and re-evaluation of patient's condition   I assumed direction of critical care for this patient from another  provider in my specialty: no     *Care during the described time interval was provided by me and/or other providers on the critical care team.  I have reviewed this patient's available data, including medical history, events of note, physical examination and test results as part of my evaluation.  **This note was  dictated using voice recognition software/Dragon.  Despite best efforts to proofread, errors can occur which can change the meaning.  Any change was purely unintentional.    Ottie Glazier, M.D.  Pulmonary [facility] [place]

## 2019-08-16 NOTE — Progress Notes (Signed)
Patient ID: Vickie Smith, female   DOB: 04-04-1966, 54 y.o.   MRN: [id number] [facility] PROGRESS NOTE  Vickie Smith BHA:[id number] DOB: [birth year] DOA: 08/12/2019 PCP: [facility] [employer]  HPI/Subjective: Patient still coughing.  Trace blood.  Hurts when she coughs.  Having some pain in the throat.  Still with some shortness of breath.  Objective: Vitals:   08/16/19 1000 08/16/19 1100  BP:  94/63  Pulse:  70  Resp: (!) 21 15  Temp:    SpO2:  98%    Intake/Output Summary (Last 24 hours) at 08/16/2019 1414 Last data filed at 08/16/2019 0251 Gross per 24 hour  Intake 1507.28 ml  Output 1600 ml  Net -92.72 ml   Filed Weights   08/15/19 0452 08/15/19 0910 08/16/19 0313  Weight: 54.5 kg 54.5 kg 54 kg    ROS: Review of Systems  Constitutional: Negative for chills and fever.  Eyes: Negative for blurred vision.  Respiratory: Positive for cough, hemoptysis and shortness of breath.   Cardiovascular: Positive for chest pain.  Gastrointestinal: Negative for abdominal pain, constipation, diarrhea, nausea and vomiting.  Genitourinary: Negative for dysuria.  Musculoskeletal: Negative for joint pain.  Neurological: Negative for dizziness and headaches.   Exam: Physical Exam  Constitutional: She is oriented to person, place, and time.  HENT:  Nose: No mucosal edema.  Mouth/Throat: No oropharyngeal exudate or posterior oropharyngeal edema.  Eyes: Pupils are equal, round, and reactive to light. Conjunctivae, EOM and lids are normal.  Neck: Carotid bruit is not present.  Cardiovascular: S1 normal and S2 normal. Exam reveals no gallop.  No murmur heard. Respiratory: No respiratory distress. She has decreased breath sounds in the right lower field and the left lower field. She has no wheezes. She has rhonchi in the right lower field and the left lower field. She has no rales.  GI: Soft. Bowel sounds are normal. There is no abdominal tenderness.  Musculoskeletal:      Right ankle: No swelling.     Left ankle: No swelling.  Lymphadenopathy:    She has no cervical adenopathy.  Neurological: She is alert and oriented to person, place, and time. No cranial nerve deficit.  Skin: Skin is warm. No rash noted. Nails show no clubbing.  Psychiatric: She has a normal mood and affect.      Data Reviewed: Basic Metabolic Panel: Recent Labs  Lab 08/12/19 0820 08/13/19 0509 08/14/19 0456 08/15/19 0730 08/16/19 0347  NA 135 139 139 140 140  K 3.9 3.9 3.9 3.8 3.2*  CL 103 107 107 109 107  CO2 20* 22 24 22 27   GLUCOSE 317* 301* 207* 151* 130*  BUN 16 18 13 14 12   CREATININE 0.48 0.35* 0.32* 0.37* 0.38*  CALCIUM 9.5 8.6* 8.5* 8.6* 8.5*  MG  --   --  2.2  --   --   PHOS  --   --  3.2  --   --    Liver Function Tests: Recent Labs  Lab 08/12/19 0820 08/13/19 0509 08/14/19 0456  AST 79* 86*  --   ALT 109* 95*  --   ALKPHOS 174* 110  --   BILITOT 0.7 0.6  --   PROT 8.4* 7.5  --   ALBUMIN 4.3 3.6 3.2*   Recent Labs  Lab 08/12/19 0820  LIPASE 56*  CBC: Recent Labs  Lab 08/12/19 0820 08/12/19 1352 08/13/19 1700 08/13/19 2230 08/14/19 0456 08/15/19 0730 08/16/19 0347  WBC 4.7   < >  12.1* 12.1* 10.8* 11.1* 9.0  NEUTROABS 2.2  --   --   --   --   --   --   HGB 15.3*   < > 13.2 12.5 12.4 12.6 12.3  HCT 42.9   < > 37.9 36.3 36.5 36.9 35.1*  MCV 87.9   < > 91.3 92.1 92.6 91.8 89.5  PLT 119*   < > 109* 104* 100* 108* 117*   < > = values in this interval not displayed.   BNP (last 3 results) Recent Labs    03/10/19 1431  BNP 53.0     CBG: Recent Labs  Lab 08/15/19 2137 08/15/19 2351 08/16/19 0304 08/16/19 0740 08/16/19 1204  GLUCAP 345* 204* 115* 149* 375*    Recent Results (from the past 240 hour(s))  Virus culture     Status: None   Collection Time: 08/12/19  9:29 AM   Specimen: Lung; Respiratory  Result Value Ref Range Status   Viral Culture Comment  Final    Comment: (NOTE) Preliminary Report: No virus isolated at 24  hours. Next report to follow after 4 days. Performed At: [facility] [place], [place] [id number] Rush Farmer MD DG:[id number]    Source of Sample BRONCHIAL WASHINGS  Final    Comment: Performed at [facility], [address] [place], [place] [address]  Respiratory Panel by RT PCR (Flu A&B, Covid) - Nasopharyngeal Swab     Status: None   Collection Time: 08/12/19 10:34 AM   Specimen: Nasopharyngeal Swab  Result Value Ref Range Status   SARS Coronavirus 2 by RT PCR NEGATIVE NEGATIVE Final    Comment: (NOTE) SARS-CoV-2 target nucleic acids are NOT DETECTED. The SARS-CoV-2 RNA is generally detectable in upper respiratoy specimens during the acute phase of infection. The lowest concentration of SARS-CoV-2 viral copies this assay can detect is 131 copies/mL. A negative result does not preclude SARS-Cov-2 infection and should not be used as the sole basis for treatment or other patient management decisions. A negative result may occur with  improper specimen collection/handling, submission of specimen other than nasopharyngeal swab, presence of viral mutation(s) within the areas targeted by this assay, and inadequate number of viral copies (<131 copies/mL). A negative result must be combined with clinical observations, patient history, and epidemiological information. The expected result is Negative. Fact Sheet for Patients:  PinkCheek.be Fact Sheet for Healthcare Providers:  GravelBags.it This test is not yet ap proved or cleared by the [place] FDA and  has been authorized for detection and/or diagnosis of SARS-CoV-2 by FDA under an Emergency Use Authorization (EUA). This EUA will remain  in effect (meaning this test can be used) for the duration of the COVID-19 declaration under Section 564(b)(1) of the Act, 21 U.S.C. section 360bbb-3(b)(1), unless the authorization is terminated  or revoked sooner.    Influenza A by PCR NEGATIVE NEGATIVE Final   Influenza B by PCR NEGATIVE NEGATIVE Final    Comment: (NOTE) The Xpert Xpress SARS-CoV-2/FLU/RSV assay is intended as an aid in  the diagnosis of influenza from Nasopharyngeal swab specimens and  should not be used as a sole basis for treatment. Nasal washings and  aspirates are unacceptable for Xpert Xpress SARS-CoV-2/FLU/RSV  testing. Fact Sheet for Patients: PinkCheek.be Fact Sheet for Healthcare Providers: GravelBags.it This test is not yet approved or cleared by the [place] FDA and  has been authorized for detection and/or diagnosis of SARS-CoV-2 by  FDA under an Emergency Use Authorization (EUA).  This EUA will remain  in effect (meaning this test can be used) for the duration of the  Covid-19 declaration under Section 564(b)(1) of the Act, 21  U.S.C. section 360bbb-3(b)(1), unless the authorization is  terminated or revoked. Performed at [facility], [place], [place] [address]   Acid Fast Smear (AFB)     Status: None   Collection Time: 08/12/19  7:10 PM   Specimen: Bronchial Alveolar Lavage; Sputum  Result Value Ref Range Status   AFB Specimen Processing Concentration  Final   Acid Fast Smear Negative  Final    Comment: (NOTE) Performed At: [facility] [place], [place] [id number] Rush Farmer MD PJ:[id number]    Source (AFB) BRONCHIAL WASHINGS  Final    Comment: Performed at [facility], [address] [place], [place] [address]  Culture, respiratory     Status: None   Collection Time: 08/12/19  7:10 PM   Specimen: Tracheal Aspirate  Result Value Ref Range Status   Specimen Description   Final    TRACHEAL ASPIRATE Performed at [facility], [address] [place], [place] [address]    Special Requests   Final    NONE Performed at [facility], [address] [place], [place] [address]    Gram Stain NO WBC SEEN NO ORGANISMS SEEN   Final   Culture   Final    NO GROWTH 2 DAYS Performed at [facility], [place] [address], [place], [place] [address]    Report Status 08/15/2019 FINAL  Final     Studies: PERIPHERAL VASCULAR CATHETERIZATION  Result Date: 08/15/2019 See op note   Scheduled Meds: . Chlorhexidine Gluconate Cloth  6 each Topical Daily  . feeding supplement (PRO-STAT SUGAR FREE 64)  30 mL Oral TID BM  . insulin aspart  0-5 Units Subcutaneous QHS  . insulin aspart  0-9 Units Subcutaneous TID WC  . insulin glargine  6 Units Subcutaneous QHS  . methylPREDNISolone (SOLU-MEDROL) injection  40 mg Intravenous Daily  . potassium chloride  40 mEq Oral Once  . promethazine  12.5 mg Intravenous Once  . sodium chloride flush  3 mL Intravenous Q12H   Continuous Infusions: . sodium chloride 50 mL/hr at 08/16/19 1209  . azithromycin Stopped (08/15/19 1904)  . cefTRIAXone (ROCEPHIN)  IV Stopped (08/15/19 1751)  . famotidine (PEPCID) IV 20 mg (08/16/19 0846)    Assessment/Plan:  1. Hemoptysis.  Bronchoscopy done on 08/12/2019.  Patient had angiogram with embolization 08/15/2019 by Dr. Delana Meyer.  Still having trace hemoptysis.  Continue to monitor hemoglobin.  Sputum for AFB's ordered and still pending.  ANA trace positive with SSB slightly elevated.  Not quite sure what to make of this.  Send off antiglomerular basement membrane protein and ANCA profile. 2. Aspiration pneumonia.  On antibiotics Rocephin and Zithromax.  Continue Solu-Medrol. 3. Type 2 diabetes mellitus with hyperglycemia.  Sugars will be high while on steroids.  A1c is elevated at 11.5.  We will start low-dose Lantus and continue sliding scale.  Likely can go back on Metformin and hopefully Amaryl upon going home. 4. Relative hypotension continue to monitor, 5. Sore throat, painful swallowing and painful when she coughs.  Patient placed on Pepcid and viscous  lidocaine I will add Cepacol throat lozenges  Code Status:     Code Status Orders  (From admission, onward)         Start     Ordered   08/12/19  Tiro  Full code  Continuous     08/12/19 1719        Code Status History    Date Active Date Inactive Code Status Order ID Comments User Context   08/12/2019 9943 08/12/2019 7190 Full Code [id number]  Flora Lipps, MD Inpatient   08/12/2019 1145 08/12/2019 1639 Full Code [id number]  Ivor Costa, MD ED   Advance Care Planning Activity     Disposition Plan: Need to send off a third sputum for AFB.  Would like to see the patient symptomatically feel little bit better prior to disposition.  May need another day or so here in the hospital.  Can transfer to regular floor bed today.  Consultants:  Vascular surgery  Pulmonary  Procedures:  Bronchoscopy 08/12/2019  Angiogram with embolization of the left upper lobe on 08/15/2019  Antibiotics:  Rocephin  Zithromax  Time spent: 27 minutes, Spanish interpreter present  [employer]

## 2019-08-17 ENCOUNTER — Inpatient Hospital Stay (HOSPITAL_COMMUNITY)
Admission: AD | Admit: 2019-08-17 | Discharge: 2019-08-20 | DRG: 204 | Disposition: A | Discharge status: Home / Self Care | Payer: Self-pay | Source: Other Acute Inpatient Hospital | Attending: Internal Medicine | Admitting: Internal Medicine

## 2019-08-17 ENCOUNTER — Inpatient Hospital Stay: Payer: Self-pay

## 2019-08-17 ENCOUNTER — Encounter (HOSPITAL_COMMUNITY): Payer: Self-pay | Admitting: Critical Care Medicine

## 2019-08-17 ENCOUNTER — Inpatient Hospital Stay (HOSPITAL_COMMUNITY): Payer: Self-pay

## 2019-08-17 DIAGNOSIS — D6959 Other secondary thrombocytopenia: Secondary | ICD-10-CM | POA: Diagnosis present

## 2019-08-17 DIAGNOSIS — K5900 Constipation, unspecified: Secondary | ICD-10-CM | POA: Diagnosis present

## 2019-08-17 DIAGNOSIS — K3189 Other diseases of stomach and duodenum: Secondary | ICD-10-CM | POA: Diagnosis present

## 2019-08-17 DIAGNOSIS — D696 Thrombocytopenia, unspecified: Secondary | ICD-10-CM

## 2019-08-17 DIAGNOSIS — K766 Portal hypertension: Secondary | ICD-10-CM | POA: Diagnosis present

## 2019-08-17 DIAGNOSIS — E1165 Type 2 diabetes mellitus with hyperglycemia: Secondary | ICD-10-CM | POA: Diagnosis present

## 2019-08-17 DIAGNOSIS — J690 Pneumonitis due to inhalation of food and vomit: Secondary | ICD-10-CM | POA: Diagnosis present

## 2019-08-17 DIAGNOSIS — Z8616 Personal history of COVID-19: Secondary | ICD-10-CM

## 2019-08-17 DIAGNOSIS — R042 Hemoptysis: Principal | ICD-10-CM | POA: Diagnosis present

## 2019-08-17 DIAGNOSIS — T380X5A Adverse effect of glucocorticoids and synthetic analogues, initial encounter: Secondary | ICD-10-CM | POA: Diagnosis present

## 2019-08-17 DIAGNOSIS — K7460 Unspecified cirrhosis of liver: Secondary | ICD-10-CM | POA: Diagnosis present

## 2019-08-17 DIAGNOSIS — E119 Type 2 diabetes mellitus without complications: Secondary | ICD-10-CM

## 2019-08-17 DIAGNOSIS — I959 Hypotension, unspecified: Secondary | ICD-10-CM | POA: Diagnosis not present

## 2019-08-17 DIAGNOSIS — Z794 Long term (current) use of insulin: Secondary | ICD-10-CM

## 2019-08-17 LAB — GLUCOSE, CAPILLARY
Glucose-Capillary: 141 mg/dL — ABNORMAL HIGH (ref 70–99)
Glucose-Capillary: 153 mg/dL — ABNORMAL HIGH (ref 70–99)
Glucose-Capillary: 249 mg/dL — ABNORMAL HIGH (ref 70–99)
Glucose-Capillary: 259 mg/dL — ABNORMAL HIGH (ref 70–99)
Glucose-Capillary: 323 mg/dL — ABNORMAL HIGH (ref 70–99)
Glucose-Capillary: 342 mg/dL — ABNORMAL HIGH (ref 70–99)
Glucose-Capillary: 351 mg/dL — ABNORMAL HIGH (ref 70–99)

## 2019-08-17 LAB — CORTISOL: Cortisol, Plasma: 3 ug/dL

## 2019-08-17 LAB — COMPREHENSIVE METABOLIC PANEL
ALT: 140 U/L — ABNORMAL HIGH (ref 0–44)
AST: 135 U/L — ABNORMAL HIGH (ref 15–41)
Albumin: 3.6 g/dL (ref 3.5–5.0)
Alkaline Phosphatase: 141 U/L — ABNORMAL HIGH (ref 38–126)
Anion gap: 11 (ref 5–15)
BUN: 13 mg/dL (ref 6–20)
CO2: 22 mmol/L (ref 22–32)
Calcium: 9 mg/dL (ref 8.9–10.3)
Chloride: 102 mmol/L (ref 98–111)
Creatinine, Ser: 0.53 mg/dL (ref 0.44–1.00)
GFR calc Af Amer: >60 mL/min (ref >60)
GFR calc non Af Amer: >60 mL/min (ref >60)
Glucose, Bld: 356 mg/dL — ABNORMAL HIGH (ref 70–99)
Potassium: 4.6 mmol/L (ref 3.5–5.1)
Sodium: 135 mmol/L (ref 135–145)
Total Bilirubin: 0.6 mg/dL (ref 0.3–1.2)
Total Protein: 7.2 g/dL (ref 6.5–8.1)

## 2019-08-17 LAB — CBC
HCT: 35.6 % — ABNORMAL LOW (ref 36.0–46.0)
HCT: 37.8 % (ref 36.0–46.0)
Hemoglobin: 12.7 g/dL (ref 12.0–15.0)
Hemoglobin: 13.4 g/dL (ref 12.0–15.0)
MCH: 31.8 pg (ref 26.0–34.0)
MCH: 31.6 pg (ref 26.0–34.0)
MCHC: 35.7 g/dL (ref 30.0–36.0)
MCHC: 35.4 g/dL (ref 30.0–36.0)
MCV: 89 fL (ref 80.0–100.0)
MCV: 89.2 fL (ref 80.0–100.0)
Platelets: 124 10*3/uL — ABNORMAL LOW (ref 150–400)
Platelets: 127 10*3/uL — ABNORMAL LOW (ref 150–400)
RBC: 4 MIL/uL (ref 3.87–5.11)
RBC: 4.24 MIL/uL (ref 3.87–5.11)
RDW: 12.3 % (ref 11.5–15.5)
RDW: 12.3 % (ref 11.5–15.5)
WBC: 9.6 10*3/uL (ref 4.0–10.5)
WBC: 6.5 10*3/uL (ref 4.0–10.5)
nRBC: 0 % (ref 0.0–0.2)
nRBC: 0 % (ref 0.0–0.2)

## 2019-08-17 LAB — AMYLASE: Amylase: 42 U/L (ref 28–100)

## 2019-08-17 LAB — APTT
aPTT: 30 seconds (ref 24–36)
aPTT: 28 seconds (ref 24–36)

## 2019-08-17 LAB — BASIC METABOLIC PANEL
Anion gap: 10 (ref 5–15)
BUN: 13 mg/dL (ref 6–20)
CO2: 26 mmol/L (ref 22–32)
Calcium: 8.9 mg/dL (ref 8.9–10.3)
Chloride: 103 mmol/L (ref 98–111)
Creatinine, Ser: 0.37 mg/dL — ABNORMAL LOW (ref 0.44–1.00)
GFR calc Af Amer: >60 mL/min (ref >60)
GFR calc non Af Amer: >60 mL/min (ref >60)
Glucose, Bld: 153 mg/dL — ABNORMAL HIGH (ref 70–99)
Potassium: 3.2 mmol/L — ABNORMAL LOW (ref 3.5–5.1)
Sodium: 139 mmol/L (ref 135–145)

## 2019-08-17 LAB — ACID FAST SMEAR (AFB, MYCOBACTERIA): Acid Fast Smear: NEGATIVE

## 2019-08-17 LAB — PROTIME-INR
INR: 1.1 (ref 0.8–1.2)
INR: 1.1 (ref 0.8–1.2)
Prothrombin Time: 14.1 seconds (ref 11.4–15.2)
Prothrombin Time: 13.8 seconds (ref 11.4–15.2)

## 2019-08-17 LAB — TYPE AND SCREEN
ABO/RH(D): O POS
Antibody Screen: NEGATIVE

## 2019-08-17 LAB — C-REACTIVE PROTEIN: CRP: 0.9 mg/dL (ref <1.0)

## 2019-08-17 LAB — SEDIMENTATION RATE: Sed Rate: 36 mm/hr — ABNORMAL HIGH (ref 0–22)

## 2019-08-17 LAB — HEMOGLOBIN A1C
Hgb A1c MFr Bld: 11 % — ABNORMAL HIGH (ref 4.8–5.6)
Mean Plasma Glucose: 269 mg/dL

## 2019-08-17 LAB — PHOSPHORUS: Phosphorus: 4.1 mg/dL (ref 2.5–4.6)

## 2019-08-17 LAB — LACTIC ACID, PLASMA
Lactic Acid, Venous: 1.5 mmol/L (ref 0.5–1.9)
Lactic Acid, Venous: 2.6 mmol/L (ref 0.5–1.9)

## 2019-08-17 LAB — LIPASE, BLOOD: Lipase: 48 U/L (ref 11–51)

## 2019-08-17 LAB — MAGNESIUM: Magnesium: 2.1 mg/dL (ref 1.7–2.4)

## 2019-08-17 LAB — STREP PNEUMONIAE URINARY ANTIGEN: Strep Pneumo Urinary Antigen: NEGATIVE

## 2019-08-17 LAB — MRSA PCR SCREENING: MRSA by PCR: NEGATIVE

## 2019-08-17 LAB — ABO/RH: ABO/RH(D): O POS

## 2019-08-17 LAB — PROCALCITONIN: Procalcitonin: <0.1 ng/mL

## 2019-08-17 MED ORDER — IOHEXOL 350 MG/ML SOLN
80.0000 mL | Freq: Once | INTRAVENOUS | Status: AC | PRN
  Administered 2019-08-17: 16:00:00 57 mL via INTRAVENOUS

## 2019-08-17 MED ORDER — LIDOCAINE VISCOUS HCL 2 % MT SOLN: 15.0000 mL | Freq: Three times a day (TID) | OROMUCOSAL | 0 refills | Status: DC | PRN

## 2019-08-17 MED ORDER — SODIUM CHLORIDE 0.9% FLUSH: 3.0000 mL | INTRAVENOUS | Status: DC | PRN

## 2019-08-17 MED ORDER — ONDANSETRON HCL 4 MG/2ML IJ SOLN
4.0000 mg | Freq: Four times a day (QID) | INTRAMUSCULAR | Status: DC | PRN
  Administered 2019-08-18: 4 mg via INTRAVENOUS
  Filled 2019-08-17: qty 2

## 2019-08-17 MED ORDER — INSULIN GLARGINE 100 UNIT/ML ~~LOC~~ SOLN: 6.0000 [IU] | Freq: Every day | SUBCUTANEOUS | 11 refills | Status: DC

## 2019-08-17 MED ORDER — FAMOTIDINE IN NACL 20-0.9 MG/50ML-% IV SOLN: 20.0000 mg | Freq: Two times a day (BID) | INTRAVENOUS | Status: DC

## 2019-08-17 MED ORDER — MENTHOL 3 MG MT LOZG: 1.0000 | LOZENGE | OROMUCOSAL | 12 refills | Status: DC | PRN

## 2019-08-17 MED ORDER — INSULIN ASPART 100 UNIT/ML ~~LOC~~ SOLN: 0.0000 [IU] | SUBCUTANEOUS | 11 refills | Status: DC

## 2019-08-17 MED ORDER — PANTOPRAZOLE SODIUM 40 MG IV SOLR: 40.0000 mg | Freq: Every day | INTRAVENOUS | Status: DC

## 2019-08-17 MED ORDER — INSULIN ASPART 100 UNIT/ML ~~LOC~~ SOLN
0.0000 [IU] | SUBCUTANEOUS | Status: DC
  Administered 2019-08-17: 20:00:00 11 [IU] via SUBCUTANEOUS
  Administered 2019-08-18: 12:00:00 20 [IU] via SUBCUTANEOUS
  Administered 2019-08-18: 7 [IU] via SUBCUTANEOUS
  Administered 2019-08-18: 08:00:00 4 [IU] via SUBCUTANEOUS

## 2019-08-17 MED ORDER — TRANEXAMIC ACID-NACL 1000-0.7 MG/100ML-% IV SOLN
1000.0000 mg | Freq: Once | INTRAVENOUS | Status: AC
  Administered 2019-08-17: 20:00:00 1000 mg via INTRAVENOUS
  Filled 2019-08-17: qty 100

## 2019-08-17 MED ORDER — ACETAMINOPHEN 325 MG PO TABS
650.0000 mg | ORAL_TABLET | ORAL | Status: DC | PRN
  Administered 2019-08-18: 01:00:00 650 mg via ORAL
  Filled 2019-08-17: qty 2

## 2019-08-17 MED ORDER — SODIUM CHLORIDE 0.9 % IV SOLN: 2.0000 g | INTRAVENOUS | Status: DC

## 2019-08-17 MED ORDER — ONDANSETRON HCL 4 MG/2ML IJ SOLN: 4.0000 mg | Freq: Four times a day (QID) | INTRAMUSCULAR | 0 refills | Status: DC | PRN

## 2019-08-17 MED ORDER — METHYLPREDNISOLONE SODIUM SUCC 40 MG IJ SOLR: 40.0000 mg | Freq: Every day | INTRAMUSCULAR | 0 refills | Status: DC

## 2019-08-17 MED ORDER — SODIUM CHLORIDE 0.9 % IV SOLN: 500.0000 mg | INTRAVENOUS | Status: DC

## 2019-08-17 MED ORDER — SENNOSIDES 8.8 MG/5ML PO SYRP: 5.0000 mL | ORAL_SOLUTION | Freq: Two times a day (BID) | ORAL | 0 refills | Status: DC | PRN

## 2019-08-17 MED ORDER — PRO-STAT SUGAR FREE PO LIQD: 30.0000 mL | Freq: Three times a day (TID) | ORAL | 0 refills | Status: DC

## 2019-08-17 MED ORDER — SODIUM CHLORIDE 0.9 % IV SOLN: INTRAVENOUS | Status: DC

## 2019-08-17 MED ORDER — BLISTEX MEDICATED EX OINT: 1.0000 "application " | TOPICAL_OINTMENT | CUTANEOUS | Status: DC | PRN

## 2019-08-17 MED ORDER — BISACODYL 5 MG PO TBEC
5.0000 mg | DELAYED_RELEASE_TABLET | Freq: Every day | ORAL | Status: DC | PRN
  Administered 2019-08-18 – 2019-08-19 (×2): 5 mg via ORAL
  Filled 2019-08-17 (×2): qty 1

## 2019-08-17 MED ORDER — TRANEXAMIC ACID-NACL 1000-0.7 MG/100ML-% IV SOLN
1000.0000 mg | Freq: Once | INTRAVENOUS | Status: DC
  Filled 2019-08-17: qty 100

## 2019-08-17 MED ORDER — HYDROCOD POLST-CPM POLST ER 10-8 MG/5ML PO SUER: 5.0000 mL | Freq: Three times a day (TID) | ORAL | Status: DC | PRN

## 2019-08-17 MED ORDER — LEVOFLOXACIN IN D5W 500 MG/100ML IV SOLN
500.0000 mg | INTRAVENOUS | Status: DC
  Administered 2019-08-17 – 2019-08-19 (×3): 500 mg via INTRAVENOUS
  Filled 2019-08-17 (×3): qty 100

## 2019-08-17 MED ORDER — PHENOL 1.4 % MT LIQD: 1.0000 | OROMUCOSAL | 0 refills | Status: DC | PRN

## 2019-08-17 MED ORDER — SODIUM CHLORIDE 0.9% FLUSH: 3.0000 mL | Freq: Two times a day (BID) | INTRAVENOUS | Status: DC

## 2019-08-17 MED ORDER — PANTOPRAZOLE SODIUM 40 MG IV SOLR
40.0000 mg | Freq: Two times a day (BID) | INTRAVENOUS | Status: DC
  Administered 2019-08-17 – 2019-08-18 (×3): 40 mg via INTRAVENOUS
  Filled 2019-08-17 (×3): qty 40

## 2019-08-17 MED ORDER — METHYLPREDNISOLONE SODIUM SUCC 125 MG IJ SOLR
60.0000 mg | Freq: Two times a day (BID) | INTRAMUSCULAR | Status: DC
  Administered 2019-08-18: 06:00:00 60 mg via INTRAVENOUS
  Filled 2019-08-17: qty 2

## 2019-08-17 MED ORDER — CHLORHEXIDINE GLUCONATE CLOTH 2 % EX PADS: 6.0000 | MEDICATED_PAD | Freq: Every day | CUTANEOUS | Status: DC

## 2019-08-17 MED ORDER — LACTATED RINGERS IV BOLUS
1000.0000 mL | Freq: Once | INTRAVENOUS | Status: AC
  Administered 2019-08-18: 1000 mL via INTRAVENOUS

## 2019-08-17 MED ORDER — INSULIN GLARGINE 100 UNIT/ML ~~LOC~~ SOLN
10.0000 [IU] | Freq: Every day | SUBCUTANEOUS | Status: DC
  Administered 2019-08-18 – 2019-08-19 (×3): 10 [IU] via SUBCUTANEOUS
  Filled 2019-08-17 (×5): qty 0.1

## 2019-08-17 MED ORDER — SENNA 8.6 MG PO TABS
1.0000 | ORAL_TABLET | Freq: Every day | ORAL | Status: DC
  Administered 2019-08-18 – 2019-08-20 (×3): 8.6 mg via ORAL
  Filled 2019-08-17 (×3): qty 1

## 2019-08-17 MED ORDER — ACETAMINOPHEN 325 MG PO TABS: 650.0000 mg | ORAL_TABLET | ORAL | Status: AC | PRN

## 2019-08-17 MED ORDER — ALUM & MAG HYDROXIDE-SIMETH 200-200-20 MG/5ML PO SUSP: 30.0000 mL | Freq: Three times a day (TID) | ORAL | 0 refills | Status: DC | PRN

## 2019-08-17 NOTE — Progress Notes (Signed)
Video call with sister. Patient alert and agreeable.

## 2019-08-17 NOTE — Progress Notes (Signed)
Updated sister via interpreter of PT's status. Informed her PT is still on airborne precautions while awaiting lab results, and visitation is restricted at this time. Also provided [place] phone number provided by SW for legal advising. PT currently video chatting sister.

## 2019-08-17 NOTE — Progress Notes (Signed)
eLink Physician-Brief Progress Note Patient Name: Vickie Smith DOB: 03/16/1966 MRN: [id number]   Date of Service  08/17/2019  HPI/Events of Note  Hypotensive (81/55) and with slight elevation of lactic acid (2.6). Cr also slightly up. WBC 6.5.  No fever.   eICU Interventions  Suspect volume depletion in setting of hemoptysis, and recent intubation at [facility]. Will order a 1L LR bolus.     Intervention Category Major Interventions: Hypotension - evaluation and management  [name] 08/17/2019, 11:55 PM

## 2019-08-17 NOTE — Progress Notes (Signed)
   08/17/19 1045  Clinical Encounter Type  Visited With Family  Visit Type Follow-up  Referral From Other (Comment)  Consult/Referral To Chaplain  Spiritual Encounters  Spiritual Needs Other (Comment)  [place] received page from volunteer in medical mall about someone who needed to speak with [facility]. This Pryor Curia arrived and was greeted by interpreter Barnie Alderman and pt's sister Cristobal Goldmann. Sister desired to complete a POA to give her legal custody of pt's daughter as well as access to bank information if pt dies. Donald shared that office only completes HPOA. Contacted pt's RN about possibility of completing document. RN said it could be signed outside pt's room. Nurse supervisor Malachy Mood shared with pt's sister that it maybe better to involve local attorney and social services since this is a durable POA and not HPOA. Pt may be transferred to [facility] and be intubated there. No further assistance can be done at this time. CH was appreciated for help.

## 2019-08-17 NOTE — Progress Notes (Signed)
Called and discussed patient's case with Dr. Gillermina Phy from [facility] regarding patient's hemoptysis and possible need for vascular/IR intervention.  Dr. Gillermina Phy has graciously accepted the patient, will transfer to [facility] once bed available.  Discussed with nursing staff.     Darel Hong, AGACNP-BC [facility] Pager: [phone]

## 2019-08-17 NOTE — Progress Notes (Signed)
eLink Physician-Brief Progress Note Patient Name: Vickie Smith DOB: [birth year] MRN: [id number]   Date of Service  08/17/2019  HPI/Events of Note  Constipation  eICU Interventions  Ordered senna PO and PRN bisacodyl PO.     Intervention Category Minor Interventions: Routine modifications to care plan (e.g. PRN medications for pain, fever)  Marily Lente Taiten Brawn 08/17/2019, 10:02 PM

## 2019-08-17 NOTE — Progress Notes (Signed)
eLink Physician-Brief Progress Note Patient Name: Vickie Smith DOB: 1966-05-22 MRN: [id number]   Date of Service  08/17/2019  HPI/Events of Note  This patient is a 18F who is currently admitted to [facility] ICU for management of hemoptysis.  She underwent a LUL bronchial artery embolization on 2/5. Unfortunately, on 2/6-2/7 (overnight) she had recurrent significant hemoptysis (~100cc). The episode subsided and she was able to protect her airway and recover, however the concern is that she may require repeat embolization procedure and this is not available at [facility] over the weekend.  She is being ruled out for TB at present (AFBs pending) so she is on airborne precautions. She is COVID negative (she previously had COVID in August 2020 and recovered).   eICU Interventions  Accepted patient for transfer to [facility] (requires a room with airborne isolation capacity due to TB rule out). Accepting physician will be Dr. Mariane Masters.      Intervention Category Minor Interventions: Communication with other healthcare providers and/or family  [name] 08/17/2019, 5:43 AM

## 2019-08-17 NOTE — Progress Notes (Signed)
CSW received a phone call from patient's RN, Jinny Blossom. CSW was informed that the patient would like to transition her custody of her child over to a family friend.   CSW informed RN that the hospital doesn't assist with legal matters. CSW informed her that it would have to go in-front of a judge. CSW provided RN with DSS information and  Legal Aid.   CSW will continue to follow and assist with disposition planning.   Domenic Schwab, MSW, [place]

## 2019-08-17 NOTE — Discharge Summary (Signed)
Physician Discharge Summary  Patient ID: Vickie Smith MRN: [id number] DOB/AGE: 11/17/1965 54 y.o.  Admit date: 08/12/2019 Discharge date: 08/17/2019    Discharge Diagnoses:                                             1) Acute hypoxic respiratory failure secondary to massive hemoptysis and aspiration pneumonia 2) moderate thrombocytopenia 3) hepatic cirrhosis 4) diabetes mellitus                      DISCHARGE PLAN BY DIAGNOSIS    1) Acute hypoxic respiratory failure secondary to massive hemoptysis and aspiration pneumonia -Supplemental O2 as needed to maintain O2 sats greater than 92% -Follow intermittent chest x-ray and ABG as needed -Currently maintaining her airway, however high risk for intubation -Status post thoracic aortogram on 08/15/2019 with embolization of the left upper lobe bronchial artery (beads and coils)  -Patient with recurrent hemoptysis on 2/7, may require repeat embolization procedure which requires transfer to [place] -Bleeding times on 2/7 within normal limits, hemoglobin stable -Rule out TB, AFBs pending -Continue airborne precautions -Completed 5 day course of azithromycin and ceftriaxone on 08/17/2019 -Follow cultures as below  2) Moderate thrombocytopenia, likely secondary to cirrhosis -Monitor for S/Sx of bleeding -Trend CBC -SCD's for VTE Prophylaxis (avoid chemical prophylaxis due to hemoptysis) -Transfuse for Hgb <8 -Hematology following, appreciate input  3) Hepatic cirrhosis  -No history of alcohol abuse -Trend LFTs -Hepatitis A, B, C all nonreactive -Connective tissue disease work-up in progress -Follow-up with GI as outpatient  4) Diabetes mellitus  -CBGs -Sliding scale insulin -Follow ICU hypo/hyperglycemia protocol                        DISCHARGE SUMMARY   Vickie Smith is a 54-year-old female with past medical history notable for COVID-19 in August 2020, thrombocytopenia, cirrhosis, and diabetes mellitus who  presented to [facility] ED on 08/12/2019 due to presumed hematemesis, which was later found out to be massive hemoptysis.  Initial work-up in the ED revealed normal coags, elevated LFTs, otherwise all other lab work unremarkable.  Her COVID-19 PCR was negative, influenza PCR was negative.  GI performed urgent EGD which was normal except for portal gastropathy (no blood found in the stomach or esophagus, no varices).  After extubation in the endoscopy suite she started coughing up fresh blood, of which Pulmonary & Critical Care Medicine was consulted for emergent bronchoscopy to evaluate for pulmonary hemorrhage.  She was intubated for the bronchoscopy, and bronchoscopy revealed massive hemoptysis from the left upper lobe.  She was admitted to ICU for further work-up and treatment of acute hypoxic respiratory failure in the setting of massive hemoptysis and aspiration pneumonia.  She is currently being ruled out for TB,  as AFBs are pending, therefore she remains on airborne precautions.  On 08/13/2019 no further hemoptysis was noted, and she was successfully extubated.  On 08/14/2019 she was noted to have continued oozing, therefore vascular surgery was consulted for possible pulmonary angiogram.  On 08/15/2019 she underwent thoracic aortogram with embolization of the left upper lobe bronchial artery.  Post embolization she had minimal hemoptysis, and was downgraded to MedSurg status on 08/16/2019  However on 08/17/2019 she had 2 separate episodes of hemoptysis (first episode with approximately 50 cc of blood, second episode with approximately   100 cc of blood).  She is currently awake, maintaining her airway, and maintaining her O2 saturations on 2 L nasal cannula.  She is high risk for intubation.  There is concern that she may require repeat embolization procedure which is currently not available here at [facility] over the weekend.  Therefore she is to be transferred to [place] for possible repeat embolization.                 SIGNIFICANT DIAGNOSTIC STUDIES 08/12/19- CTA Chest>>1. No definite evidence of pulmonary embolus. 2. Aspirated material is noted in the distal trachea and bilateral bronchi. 3. Bilateral posterior basilar opacities are noted concerning for atelectasis or aspiration pneumonia. Left upper lobe opacity is also noted concerning for pneumonia. 08/12/19- EGD>> Normal duodenal bulb and second portion of the  Duodenum. Portal hypertensive gastropathy. Normal gastroesophageal junction and esophagus. No specimens collected. 08/12/19- Bronchoscopy>> Massive hemoptysis from LUL 08/17/19- CXR>>1. Status post extubation. 2. Faint densities in the left upper lobe, likely related to pulmonary hemorrhage as seen on the patient's prior CT PE study. 3. New embolization coils project over the aortic arch.  MICRO DATA  SARS-CoV-2 PCR 2/2>> negative Influenza PCR 2/2>> negative Hepatitis A antibody 2/2>> nonreactive Hepatitis B surface antigen 2/2>> nonreactive Hepatitis C antibody 2/2>> nonreactive HIV 2/2>> nonreactive Acid-fast culture 2/2>> pending AFB 2/2>> pending Bronchoalveolar Lavage culture 2/2>> pending Tracheal aspirate 2/2>> no growth to date Acid-fast culture 2/5>> pending Acid-fast culture 2/7>> pending  ANTIBIOTICS Azithromycin 2/2>>2/7 Cefazolin x1 dose 2/5 (surgical prophylaxis) Ceftriaxone 2/2>>2/7  CONSULTS GI  PCCM Vascular surgery Hematology  TUBES / LINES ET tube 2/2>>2/3   Discharge Exam: General: Acutely ill-appearing female, sitting in bed, on 2 L nasal cannula, no acute distress Neuro: Awake, alert and oriented x4, follows commands, speech clear, no focal deficit CV: Regular rate and rhythm, S1-S2, no murmurs rubs or gallops, 2+ pulses throughout PULM: Clear diminished to auscultation throughout, even, nonlabored GI: Soft, nontender, nondistended, no guarding or rebound tenderness, bowel sounds positive x4 Extremities: Normal bulk and tone, no deformities, no  edema  Vitals:   08/16/19 1800 08/17/19 0030 08/17/19 0412 08/17/19 0500  BP:  105/70 96/84   Pulse: 71 69 70   Resp: 13 17 14   Temp:      TempSrc:      SpO2: 98% 100% 97%   Weight:    51.7 kg  Height:         Discharge Labs  BMET Recent Labs  Lab 08/13/19 0509 08/13/19 0509 08/14/19 0456 08/14/19 0456 08/15/19 0730 08/15/19 0730 08/16/19 0347 08/17/19 0511  NA 139  --  139  --  140  --  140 139  K 3.9   < > 3.9   < > 3.8   < > 3.2* 3.2*  CL 107  --  107  --  109  --  107 103  CO2 22  --  24  --  22  --  27 26  GLUCOSE 301*  --  207*  --  151*  --  130* 153*  BUN 18  --  13  --  14  --  12 13  CREATININE 0.35*  --  0.32*  --  0.37*  --  0.38* 0.37*  CALCIUM 8.6*  --  8.5*  --  8.6*  --  8.5* 8.9  MG  --   --  2.2  --   --   --   --   --     PHOS  --   --  3.2  --   --   --   --   --    < > = values in this interval not displayed.    CBC Recent Labs  Lab 08/14/19 0456 08/15/19 0730 08/16/19 0347  HGB 12.4 12.6 12.3  HCT 36.5 36.9 35.1*  WBC 10.8* 11.1* 9.0  PLT 100* 108* 117*    Anti-Coagulation Recent Labs  Lab 08/12/19 0820 08/15/19 0730  INR 1.0 1.2          Allergies as of 08/17/2019   No Known Allergies     Medication List    TAKE these medications   acetaminophen 325 MG tablet Commonly known as: TYLENOL Take 2 tablets (650 mg total) by mouth every 4 (four) hours as needed for mild pain (temp > 101.5). What changed:   medication strength  how much to take  when to take this  reasons to take this   alum & mag hydroxide-simeth 144-818-56 MG/5ML suspension Commonly known as: MAALOX/MYLANTA Take 30 mLs by mouth every 8 (eight) hours as needed for indigestion or heartburn.   azithromycin 500 mg in sodium chloride 0.9 % 250 mL Inject 500 mg into the vein daily.   cefTRIAXone 2 g in sodium chloride 0.9 % 100 mL Inject 2 g into the vein daily.   Chlorhexidine Gluconate Cloth 2 % Pads Apply 6 each topically daily.    chlorpheniramine-HYDROcodone 10-8 MG/5ML Suer Commonly known as: TUSSIONEX Take 5 mLs by mouth every 8 (eight) hours as needed for cough.   famotidine 20-0.9 MG/50ML-% Commonly known as: PEPCID Inject 50 mLs (20 mg total) into the vein every 12 (twelve) hours.   feeding supplement (PRO-STAT SUGAR FREE 64) Liqd Take 30 mLs by mouth 3 (three) times daily between meals.   insulin aspart 100 UNIT/ML injection Commonly known as: novoLOG Inject 0-9 Units into the skin every 4 (four) hours.   insulin glargine 100 UNIT/ML injection Commonly known as: LANTUS Inject 0.06 mLs (6 Units total) into the skin at bedtime.   lidocaine 2 % solution Commonly known as: XYLOCAINE Use as directed 15 mLs in the mouth or throat every 8 (eight) hours as needed for mouth pain.   lip balm Oint Apply 1 application topically as needed for lip care.   menthol-cetylpyridinium 3 MG lozenge Commonly known as: CEPACOL Take 1 lozenge (3 mg total) by mouth every 2 (two) hours as needed for sore throat.   methylPREDNISolone sodium succinate 40 mg/mL injection Commonly known as: SOLU-MEDROL Inject 1 mL (40 mg total) into the vein daily.   ondansetron 4 MG/2ML Soln injection Commonly known as: ZOFRAN Inject 2 mLs (4 mg total) into the vein every 6 (six) hours as needed for nausea.   phenol 1.4 % Liqd Commonly known as: CHLORASEPTIC Use as directed 1 spray in the mouth or throat as needed for throat irritation / pain.   sennosides 8.8 MG/5ML syrup Commonly known as: SENOKOT Place 5 mLs into feeding tube 2 (two) times daily as needed for mild constipation.   sodium chloride flush 0.9 % Soln Commonly known as: NS Inject 3 mLs into the vein every 12 (twelve) hours.   sodium chloride flush 0.9 % Soln Commonly known as: NS Inject 3 mLs into the vein as needed.         Disposition: ICU  Discharged Condition: Anum Palecek has met maximum benefit of inpatient care and is medically stable and  cleared for transfer to  [place].       Time spent on disposition:  45 Minutes.   Signed: Darel Hong, AGACNP-BC [place] [facility] Pager: [phone]

## 2019-08-17 NOTE — Progress Notes (Signed)
Report called to Jinny Blossom, Therapist, sports at [facility]. Report also given to [facility], Therapist, sports.

## 2019-08-17 NOTE — Progress Notes (Signed)
Again called to bedside by nursing as pt with episode of hemoptysis (approx. 100 ml).  Upon my arrival to bedside, pt remains awake and alert, RR 12-16, SpO2 98% on 2L [place], maintaining her airway.  Episode has subsided.  Called and discussed with Dr. Trula Slade of Vascular Surgery of need for possible repeat Thoracic aortagram and embolization.  Per Dr. Trula Slade, if pt needs embolization, then she will need to be transferred to outside facility.  Will check STAT CBC and bleeding times and discuss with Dr. Lanney Gins.  Continue to monitor.     Darel Hong, AGACNP-BC [place] [facility] Pager: [phone]

## 2019-08-17 NOTE — Progress Notes (Signed)
Called to bedside by nursing as pt with episode of hemoptysis (approx. 30 to 50 cc).  Initially pt reported shortness of breath with episode.  Upon my arrival, pt is awake and alert, currently maintaining her airway, in no acute respiratory distress, and O2 sats 96-98% on 2L [place].  At the moment her hemoptysis has subsided.  Will obtain STAT CXR.  Informed her that if hemoptysis continues she may require intubation and Bronchoscopy.  Continue to monitor.  Will cancel transfer to Med-Surg unit and keep her in [place].     Darel Hong, AGACNP-BC [place] [facility] Pager: [phone]

## 2019-08-17 NOTE — Plan of Care (Signed)
Episode of hemoptysis just before CT. She is able to cough and clear her airways. Transiently tachycardic, but recovers. On RA saturating well. Not in distress.  CTA with improving infiltrates. No PE. Coils seen from previous embolization in L bronchial arteries.  D/w IR- not sure that additional intervention would be helpful. We would favor a conservative approach of TXA and increasing steroids given inflammatory markers with her clinical stability and mildly elevated ESR & CRP, although inflammatory DAH seems very unlikely.   1g TXA given now. If needed additional doses of 559m can be given overnight per discussion with pharmacy.  LJulian Hy DO 08/17/19 6:37 PM [place] [facility]

## 2019-08-17 NOTE — H&P (Addendum)
PULMONARY / CRITICAL CARE MEDICINE   NAME:  Vickie Smith, MRN:  [id number], DOB:  16-Sep-1965, LOS: 0 ADMISSION DATE:  08/17/2019, CONSULTATION DATE: 08/17/2019 REFERRING MD: [facility], CHIEF COMPLAINT: Recurrent hemoptysis  BRIEF HISTORY:    54 year old with recurrent hemoptysis HISTORY OF PRESENT ILLNESS   Vickie Smith is a 54 year old Spanish-speaking female who presented to [facility] on 08/12/2019 with hemoptysis.  She developed acute hypoxic respiratory failure secondary to massive hemoptysis and aspiration pneumonia.  sHe was also noted to have moderate thrombocytopenia hepatic cirrhosis and diabetes mellitus. While at [facility] her to hypoxic respiratory failure were addressed with intubation, 08/15/2019 left and right patient for interventional radiology. Unfortunately she had recurrent hemoptysis and was transferred to [facility] for further evaluation and treatment patient intersected and sent for radiology.  He is also being ruled out for tuberculosis.  He has diabetes with steroid exacerbation. Her pneumonia was treated with Rocephin and Zithromax.  We will discontinue Rocephin and Zithromax and place her on Levaquin for broader coverage. Further evaluation and treatment of her ongoing bleeding issues will be continued. SIGNIFICANT PAST MEDICAL HISTORY   Hemoptysis Hx of covid 2020 8/19  SIGNIFICANT EVENTS:  08/16/2018 recurrent hemoptysis STUDIES:    CULTURES:  08/15/2019 AFB>>  ANTIBIOTICS:  08/17/2019 Levaquin>>  LINES/TUBES:    CONSULTANTS:   SUBJECTIVE:  54 year old Hispanic female who has had recurrent hemoptysis despite embolization of bronchial artery.  Presents to [facility] for further evaluation and treatment also for tuberculosis work-up.  CONSTITUTIONAL: There were no vitals taken for this visit.  No intake/output data recorded.        PHYSICAL EXAM: General: Middle-aged female in no acute  distress during the observation examination Neuro: Grossly intact does not speak English but able to communicate via translator HEENT: No JVD is appreciated no scleral edema is appreciated.  Submandibular adenopathy is noted Cardiovascular: Heart sounds are regular regular rate and rhythm Lungs: Lungs essentially clear but does have a cough reflex with deep breath Abdomen: Unremarkable positive bowel sounds no abdominal distention Musculoskeletal: Grossly intact Skin: Warm and dry  RESOLVED PROBLEM LIST   ASSESSMENT AND PLAN   Recurrent hemoptysis in the setting of interventional radiology embolization of right left bronchial artery at [facility]. Repeat CTA of chest Continue broad-spectrum antibiotics Questionable role for Amicar There may be a role for fiberoptic bronchoscopy in the future. Continue work-up for tuberculosis Monitor coagulation studies Continue Solu-Medrol 40 mg IV daily  Cirrhosis Suspected portal hypertension Continue to monitor Check liver functions  Diabetes with steroid exacerbation Sliding-scale insulin protocol   SUMMARY OF TODAY'S PLAN:  Vickie Smith is a 54 year old Spanish-speaking female who presented to [facility] on 08/12/2019 with hemoptysis.  She developed acute hypoxic respiratory failure secondary to massive hemoptysis and aspiration pneumonia.  sHe was also noted to have moderate thrombocytopenia hepatic cirrhosis and diabetes mellitus. While at [facility] her to hypoxic respiratory failure were addressed with intubation, 08/15/2019 left and right patient for interventional radiology. Unfortunately she had recurrent hemoptysis and was transferred to [facility] for further evaluation and treatment patient intersected and sent for radiology.  He is also being ruled out for tuberculosis.  He has diabetes with steroid exacerbation. Her pneumonia was treated with Rocephin and Zithromax.  We will  discontinue Rocephin and Zithromax and place her on Levaquin for broader coverage. Further evaluation and treatment of her ongoing bleeding issues will be continued.  Best Practice /  Goals of Care / Disposition.   DVT PROPHYLAXIS: Pneumatic stockings SUP: PPI NUTRITION: N.p.o. MOBILITY: Bedrest GOALS OF CARE: Full code FAMILY DISCUSSIONS: Patient updated bedside using translator DISPOSITION transferred from [facility] 08/17/2019 to [facility] unit 08/17/2019  LABS  Glucose Recent Labs  Lab 08/16/19 1631 08/16/19 2139 08/17/19 0027 08/17/19 0509 08/17/19 0755 08/17/19 1253  GLUCAP 317* 263* 249* 141* 153* 351*    BMET Recent Labs  Lab 08/15/19 0730 08/16/19 0347 08/17/19 0511  NA 140 140 139  K 3.8 3.2* 3.2*  CL 109 107 103  CO2 22 27 26   BUN 14 12 13   CREATININE 0.37* 0.38* 0.37*  GLUCOSE 151* 130* 153*    Liver Enzymes Recent Labs  Lab 08/12/19 0820 08/13/19 0509 08/14/19 0456  AST 79* 86*  --   ALT 109* 95*  --   ALKPHOS 174* 110  --   BILITOT 0.7 0.6  --   ALBUMIN 4.3 3.6 3.2*    Electrolytes Recent Labs  Lab 08/14/19 0456 08/14/19 0456 08/15/19 0730 08/16/19 0347 08/17/19 0511  CALCIUM 8.5*   < > 8.6* 8.5* 8.9  MG 2.2  --   --   --   --   PHOS 3.2  --   --   --   --    < > = values in this interval not displayed.    CBC Recent Labs  Lab 08/15/19 0730 08/16/19 0347 08/17/19 0511  WBC 11.1* 9.0 9.6  HGB 12.6 12.3 12.7  HCT 36.9 35.1* 35.6*  PLT 108* 117* 124*    ABG Recent Labs  Lab 08/13/19 0421  PHART 7.38  PCO2ART 37  PO2ART 268*    Coag's Recent Labs  Lab 08/12/19 0820 08/12/19 1352 08/15/19 0730 08/17/19 0511  APTT  --  33  --  30  INR 1.0  --  1.2 1.1    Sepsis Markers No results for input(s): LATICACIDVEN, PROCALCITON, O2SATVEN in the last 168 hours.  Cardiac Enzymes No results for input(s): TROPONINI, PROBNP in the last 168 hours.  PAST MEDICAL HISTORY :   She  has a past  medical history of Diabetes mellitus without complication ([place]).  PAST SURGICAL HISTORY:  She  has a past surgical history that includes Esophagogastroduodenoscopy (N/A, 08/12/2019).  No Known Allergies  No current facility-administered medications on file prior to encounter.   Current Outpatient Medications on File Prior to Encounter  Medication Sig  . acetaminophen (TYLENOL) 325 MG tablet Take 2 tablets (650 mg total) by mouth every 4 (four) hours as needed for mild pain (temp > 101.5).  Marland Kitchen alum & mag hydroxide-simeth (MAALOX/MYLANTA) 200-200-20 MG/5ML suspension Take 30 mLs by mouth every 8 (eight) hours as needed for indigestion or heartburn.  . Amino Acids-Protein Hydrolys (FEEDING SUPPLEMENT, PRO-STAT SUGAR FREE 64,) LIQD Take 30 mLs by mouth 3 (three) times daily between meals.  Marland Kitchen azithromycin 500 mg in sodium chloride 0.9 % 250 mL Inject 500 mg into the vein daily.  . cefTRIAXone 2 g in sodium chloride 0.9 % 100 mL Inject 2 g into the vein daily.  . Chlorhexidine Gluconate Cloth 2 % PADS Apply 6 each topically daily.  . chlorpheniramine-HYDROcodone (TUSSIONEX) 10-8 MG/5ML SUER Take 5 mLs by mouth every 8 (eight) hours as needed for cough.  . famotidine (PEPCID) 20-0.9 MG/50ML-% Inject 50 mLs (20 mg total) into the vein every 12 (twelve) hours.  . insulin aspart (NOVOLOG) 100 UNIT/ML injection Inject 0-9 Units into the skin every  4 (four) hours.  . insulin glargine (LANTUS) 100 UNIT/ML injection Inject 0.06 mLs (6 Units total) into the skin at bedtime.  . lidocaine (XYLOCAINE) 2 % solution Use as directed 15 mLs in the mouth or throat every 8 (eight) hours as needed for mouth pain.  Marland Kitchen lip balm (BLISTEX) OINT Apply 1 application topically as needed for lip care.  . menthol-cetylpyridinium (CEPACOL) 3 MG lozenge Take 1 lozenge (3 mg total) by mouth every 2 (two) hours as needed for sore throat.  . methylPREDNISolone sodium succinate (SOLU-MEDROL) 40 mg/mL injection Inject 1 mL (40 mg total)  into the vein daily.  . ondansetron (ZOFRAN) 4 MG/2ML SOLN injection Inject 2 mLs (4 mg total) into the vein every 6 (six) hours as needed for nausea.  . phenol (CHLORASEPTIC) 1.4 % LIQD Use as directed 1 spray in the mouth or throat as needed for throat irritation / pain.  Marland Kitchen sennosides (SENOKOT) 8.8 MG/5ML syrup Place 5 mLs into feeding tube 2 (two) times daily as needed for mild constipation.  . sodium chloride flush (NS) 0.9 % SOLN Inject 3 mLs into the vein every 12 (twelve) hours.  . sodium chloride flush (NS) 0.9 % SOLN Inject 3 mLs into the vein as needed.    FAMILY HISTORY:   Her family history is not on file.  SOCIAL HISTORY:  She  reports that she has never smoked. She has never used smokeless tobacco. She reports that she does not drink alcohol or use drugs.  REVIEW OF SYSTEMS:    10 point review of system taken, please see HPI for positives and negatives.   Richardson Landry Keeyon Privitera ACNP Acute Care Nurse Practitioner [facility] Please consult [place] 08/17/2019, 1:30 PM

## 2019-08-18 ENCOUNTER — Encounter: Payer: Self-pay | Admitting: Cardiology

## 2019-08-18 ENCOUNTER — Other Ambulatory Visit: Payer: Self-pay

## 2019-08-18 LAB — BASIC METABOLIC PANEL
Anion gap: 10 (ref 5–15)
BUN: 13 mg/dL (ref 6–20)
CO2: 24 mmol/L (ref 22–32)
Calcium: 8.9 mg/dL (ref 8.9–10.3)
Chloride: 104 mmol/L (ref 98–111)
Creatinine, Ser: 0.52 mg/dL (ref 0.44–1.00)
GFR calc Af Amer: >60 mL/min (ref >60)
GFR calc non Af Amer: >60 mL/min (ref >60)
Glucose, Bld: 119 mg/dL — ABNORMAL HIGH (ref 70–99)
Potassium: 3.3 mmol/L — ABNORMAL LOW (ref 3.5–5.1)
Sodium: 138 mmol/L (ref 135–145)

## 2019-08-18 LAB — POCT I-STAT 7, (LYTES, BLD GAS, ICA,H+H)
Acid-Base Excess: 3 mmol/L — ABNORMAL HIGH (ref 0.0–2.0)
Bicarbonate: 27.1 mmol/L (ref 20.0–28.0)
Calcium, Ion: 1.26 mmol/L (ref 1.15–1.40)
HCT: 33 % — ABNORMAL LOW (ref 36.0–46.0)
Hemoglobin: 11.2 g/dL — ABNORMAL LOW (ref 12.0–15.0)
O2 Saturation: 96 %
Patient temperature: 98.6
Potassium: 3.2 mmol/L — ABNORMAL LOW (ref 3.5–5.1)
Sodium: 141 mmol/L (ref 135–145)
TCO2: 28 mmol/L (ref 22–32)
pCO2 arterial: 39.5 mmHg (ref 32.0–48.0)
pH, Arterial: 7.445 (ref 7.350–7.450)
pO2, Arterial: 76 mmHg — ABNORMAL LOW (ref 83.0–108.0)

## 2019-08-18 LAB — CBC
HCT: 34 % — ABNORMAL LOW (ref 36.0–46.0)
Hemoglobin: 11.9 g/dL — ABNORMAL LOW (ref 12.0–15.0)
MCH: 31.3 pg (ref 26.0–34.0)
MCHC: 35 g/dL (ref 30.0–36.0)
MCV: 89.5 fL (ref 80.0–100.0)
Platelets: 143 10*3/uL — ABNORMAL LOW (ref 150–400)
RBC: 3.8 MIL/uL — ABNORMAL LOW (ref 3.87–5.11)
RDW: 12.2 % (ref 11.5–15.5)
WBC: 10.3 10*3/uL (ref 4.0–10.5)
nRBC: 0.2 % (ref 0.0–0.2)

## 2019-08-18 LAB — GLUCOSE, CAPILLARY
Glucose-Capillary: 101 mg/dL — ABNORMAL HIGH (ref 70–99)
Glucose-Capillary: 157 mg/dL — ABNORMAL HIGH (ref 70–99)
Glucose-Capillary: 225 mg/dL — ABNORMAL HIGH (ref 70–99)
Glucose-Capillary: 264 mg/dL — ABNORMAL HIGH (ref 70–99)
Glucose-Capillary: 327 mg/dL — ABNORMAL HIGH (ref 70–99)
Glucose-Capillary: 362 mg/dL — ABNORMAL HIGH (ref 70–99)
Glucose-Capillary: 385 mg/dL — ABNORMAL HIGH (ref 70–99)

## 2019-08-18 LAB — HEPATITIS B CORE ANTIBODY, IGM: Hep B C IgM: NONREACTIVE

## 2019-08-18 LAB — HEPATITIS A ANTIBODY, IGM: Hep A IgM: NONREACTIVE

## 2019-08-18 LAB — GLOMERULAR BASEMENT MEMBRANE ANTIBODIES: GBM Ab: 3 units (ref 0–20)

## 2019-08-18 LAB — MAGNESIUM: Magnesium: 2.1 mg/dL (ref 1.7–2.4)

## 2019-08-18 MED ORDER — METHYLPREDNISOLONE SODIUM SUCC 40 MG IJ SOLR
20.0000 mg | Freq: Two times a day (BID) | INTRAMUSCULAR | Status: DC
  Administered 2019-08-18 – 2019-08-19 (×2): 20 mg via INTRAVENOUS
  Filled 2019-08-18: qty 1
  Filled 2019-08-18: qty 0.5
  Filled 2019-08-18: qty 1

## 2019-08-18 MED ORDER — CHLORHEXIDINE GLUCONATE CLOTH 2 % EX PADS
6.0000 | MEDICATED_PAD | Freq: Every day | CUTANEOUS | Status: DC
  Administered 2019-08-18 – 2019-08-19 (×2): 6 via TOPICAL

## 2019-08-18 MED ORDER — POTASSIUM CHLORIDE CRYS ER 20 MEQ PO TBCR
40.0000 meq | EXTENDED_RELEASE_TABLET | Freq: Once | ORAL | Status: AC
  Administered 2019-08-18: 40 meq via ORAL
  Filled 2019-08-18: qty 2

## 2019-08-18 MED ORDER — SODIUM CHLORIDE 0.9 % IV SOLN: INTRAVENOUS | Status: DC | PRN

## 2019-08-18 MED ORDER — POTASSIUM CHLORIDE 10 MEQ/100ML IV SOLN
10.0000 meq | INTRAVENOUS | Status: DC
  Administered 2019-08-18: 10 meq via INTRAVENOUS
  Filled 2019-08-18: qty 100

## 2019-08-18 MED ORDER — INSULIN ASPART 100 UNIT/ML ~~LOC~~ SOLN
0.0000 [IU] | Freq: Three times a day (TID) | SUBCUTANEOUS | Status: DC
  Administered 2019-08-18: 18:00:00 11 [IU] via SUBCUTANEOUS
  Administered 2019-08-19: 15 [IU] via SUBCUTANEOUS
  Administered 2019-08-19: 13:00:00 4 [IU] via SUBCUTANEOUS
  Administered 2019-08-19 – 2019-08-20 (×2): 15 [IU] via SUBCUTANEOUS
  Administered 2019-08-20: 09:00:00 3 [IU] via SUBCUTANEOUS

## 2019-08-18 MED ORDER — INSULIN ASPART 100 UNIT/ML ~~LOC~~ SOLN
0.0000 [IU] | Freq: Every day | SUBCUTANEOUS | Status: DC
  Administered 2019-08-18: 4 [IU] via SUBCUTANEOUS
  Administered 2019-08-19: 3 [IU] via SUBCUTANEOUS

## 2019-08-18 NOTE — Progress Notes (Signed)
Report called to christy RN [phone]

## 2019-08-18 NOTE — Progress Notes (Signed)
D/w IP.  BAL smear for AFB was negative.  Based on this she does not require airborne isolation > will d/c this.  Chesley Mires, MD [facility] Pulmonary/Critical Care 08/18/2019, 12:30 PM

## 2019-08-18 NOTE — Progress Notes (Signed)
CBG 385 reported to Dr Halford Chessman

## 2019-08-18 NOTE — Progress Notes (Addendum)
Patient transferred to [id number] via wheelchair

## 2019-08-18 NOTE — Progress Notes (Signed)
PULMONARY / CRITICAL CARE MEDICINE   NAME:  Vickie Smith, MRN:  [id number], DOB:  1966/02/17, LOS: 1 ADMISSION DATE:  08/17/2019 CHIEF COMPLAINT: Recurrent hemoptysis REFERRING PROVIDER: Dr. Lanney Gins, [facility]  BRIEF HISTORY:    54 yo female immigrant from [place] presented to [facility] with hemoptysis and hypoxia.  Also found to have thrombocytopenia, hepatic cirrhosis, and diabetes.  Required intubation and bronchoscopy with localization of bleeding to LUL.  She had pulmonary embolization by IR with improved symptoms.  Developed recurrent hemoptysis 2/07 and transferred to [facility]. Marland Kitchen SIGNIFICANT PAST MEDICAL HISTORY   COVID 23 in August 2020, DM type II  SIGNIFICANT EVENTS:  2/02 Admit to [facility], intubated 2/03 Extubated 2/05 embolization to Lt upper lobe 2/07 recurrent hemoptysis >> transfer to [facility]  STUDIES:   Abd u/s 2/02 >> cirrhosis Liver doppler 2/02 >> negative CT angio chest 2/02 >> b/l basilar opacities, LUL opacity EGD 2/02 >> portal hypertensive gastropathy Serology 2/04 >> ANA positive CT angio chest 2/07 >> secretions in Lt main bronchus, improved aeration, decreased GGO LUL  CULTURES:  SARS CoV2 PCR 2/02 >> negative Influenza PCR 2/02 >> negative BAL 2/02 >> negative BAL AFB 2/02 >> smear negative >>  Sputum AFB 2/05 >> smear negative >>   ANTIBIOTICS:  Rocephin 2/02 >> 2/06 Zithromax 2/02 >> 2/06  Levaquin 2/07 >>   LINES/TUBES:  ETT 2/02 >> 2/03  CONSULTANTS:  IR Vascular surgery  SUBJECTIVE:  Not having cough.  Denies chest pain, nausea.  OBJECTIVE:   CONSTITUTIONAL: BP 102/65   Pulse 76   Temp 98.5 F (36.9 C) (Oral)   Resp 13   Wt 51 kg   SpO2 96%   BMI 24.33 kg/m   I/O last 3 completed shifts: In: 1290.6 [I.V.:251.7; IV Piggyback:1038.9] Out: 52 [Urine:700; Other:15]  PHYSICAL EXAM:  General - alert Eyes - pupils reactive ENT - no sinus tenderness, no stridor Cardiac - regular rate/rhythm, no murmur Chest - equal breath sounds b/l, no  wheezing or rales Abdomen - soft, non tender, + bowel sounds Extremities - no cyanosis, clubbing, or edema Skin - no rashes Neuro - normal strength, moves extremities, follows commands Psych - normal mood and behavior   RESOLVED PROBLEM LIST    ASSESSMENT AND PLAN    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 - AFB smear from BAL done on 2/02 negative >> will check with infection prevention on whether this should be sufficient to d/c airborne isolation - continue levaquin - f/u culture results - change to solumedrol 20 mg bid and wean off as able  Cirrhosis with portal hypertension. - ANA, SSB positive from 2/04 - A1AT pending from 2/04 - ANCA pending from 2/07 - check hep viral panel - f/u LFTs  DM type II poorly controlled with steroid induced hyperglycemia. - SSI with lantus   Best Practice / Goals of Care / Disposition.   DVT PROPHYLAXIS: SCDs SUP: not indicated NUTRITION: carb modified MOBILITY: as tolerated GOALS OF CARE: Full code DISPOSITION: med surg  LABS   CMP Latest Ref Rng & Units 08/18/2019 08/18/2019 08/17/2019  Glucose 70 - 99 mg/dL - 119(H) 356(H)  BUN 6 - 20 mg/dL - 13 13  Creatinine 0.44 - 1.00 mg/dL - 0.52 0.53  Sodium 135 - 145 mmol/L 141 138 135  Potassium 3.5 - 5.1 mmol/L 3.2(L) 3.3(L) 4.6  Chloride 98 - 111 mmol/L - 104 102  CO2 22 - 32 mmol/L - 24 22  Calcium 8.9 - 10.3 mg/dL - 8.9  9.0  Total Protein 6.5 - 8.1 g/dL - - 7.2  Total Bilirubin 0.3 - 1.2 mg/dL - - 0.6  Alkaline Phos 38 - 126 U/L - - 141(H)  AST 15 - 41 U/L - - 135(H)  ALT 0 - 44 U/L - - 140(H)    CBC Latest Ref Rng & Units 08/18/2019 08/18/2019 08/17/2019  WBC 4.0 - 10.5 K/uL - 10.3 6.5  Hemoglobin 12.0 - 15.0 g/dL 11.2(L) 11.9(L) 13.4  Hematocrit 36.0 - 46.0 % 33.0(L) 34.0(L) 37.8  Platelets 150 - 400 K/uL - 143(L) 127(L)    ABG    Component Value Date/Time   PHART 7.445 08/18/2019 0438   PCO2ART 39.5 08/18/2019 0438   PO2ART 76.0 (L) 08/18/2019 0438   HCO3 27.1  08/18/2019 0438   TCO2 28 08/18/2019 0438   ACIDBASEDEF 2.8 (H) 08/13/2019 0421   O2SAT 96.0 08/18/2019 0438    CBG (last 3)  Recent Labs    08/18/19 0348 08/18/19 0753 08/18/19 [place], MD [place] 08/18/2019, 12:11 PM

## 2019-08-18 NOTE — Consult Note (Signed)
Chief Complaint: Patient was seen in consultation today for consideration of Pulmonary embolization at the request of CCM  Referring Physician(s): Dr Renaldo Harrison  Supervising Physician: Sandi Mariscal  Patient Status: [facility] - In-pt  History of Present Illness: Vickie Smith is a 54 y.o. female   Pt with onset of massive hemoptysis and bloody vomitus 08/12/19 Presented to [facility] Also with HA; body aches; wt loss Covid + 02/2019 No known TB exposures  Bronchoscopy revealed LUL bleeding site Underwent Pulmonary arteriogram and bronchial artery embolization with Vasc Surgery at [facility] 08/15/19  Refractory hemoptysis Transferred to [facility]  CTA yesterday: IMPRESSION: 1. Negative examination for pulmonary embolism. 2. Significant improvement in aeration of the bilateral lung bases, with resolution of previously seen atelectasis or consolidation. 3. Generally improved nodular airspace opacity throughout the lungs, with some persistent, scattered ground-glass and heterogeneous opacity of the left upper lobe. 4. Adherent debris or secretions in the left mainstem bronchus. 5. Coil or embolic material present in left-sided bronchial and intercostal arteries.  Request to see pt and consider if any additional intervention is needed for this pt.  Using Interpreter machine in room; Spanish Interpreter Beverlee Nims: She states she has had little to no bleeding/hemopatysis since last pm Denies pain She does state she feels weak; denies N/V Breathing much better today.   Past Medical History:  Diagnosis Date  . Diabetes mellitus without complication [facility])     Past Surgical History:  Procedure Laterality Date  . ESOPHAGOGASTRODUODENOSCOPY N/A 08/12/2019   Procedure: ESOPHAGOGASTRODUODENOSCOPY (EGD);  Surgeon: Lin Landsman, MD;  Location: [facility] ENDOSCOPY;  Service: Gastroenterology;  Laterality: N/A;    Allergies: Patient has no known allergies.  Medications: Prior to Admission  medications   Medication Sig Start Date End Date Taking? Authorizing Provider  acetaminophen (TYLENOL) 325 MG tablet Take 2 tablets (650 mg total) by mouth every 4 (four) hours as needed for mild pain (temp > 101.5). 08/17/19   Bradly Bienenstock, NP  alum & mag hydroxide-simeth (MAALOX/MYLANTA) 200-200-20 MG/5ML suspension Take 30 mLs by mouth every 8 (eight) hours as needed for indigestion or heartburn. 08/17/19   Bradly Bienenstock, NP  Amino Acids-Protein Hydrolys (FEEDING SUPPLEMENT, PRO-STAT SUGAR FREE 64,) LIQD Take 30 mLs by mouth 3 (three) times daily between meals. 08/17/19   Darel Hong D, NP  azithromycin 500 mg in sodium chloride 0.9 % 250 mL Inject 500 mg into the vein daily. 08/17/19   Darel Hong D, NP  cefTRIAXone 2 g in sodium chloride 0.9 % 100 mL Inject 2 g into the vein daily. 08/17/19   Bradly Bienenstock, NP  Chlorhexidine Gluconate Cloth 2 % PADS Apply 6 each topically daily. 08/17/19   Bradly Bienenstock, NP  chlorpheniramine-HYDROcodone (TUSSIONEX) 10-8 MG/5ML SUER Take 5 mLs by mouth every 8 (eight) hours as needed for cough. 08/17/19   Bradly Bienenstock, NP  famotidine (PEPCID) 20-0.9 MG/50ML-% Inject 50 mLs (20 mg total) into the vein every 12 (twelve) hours. 08/17/19   Bradly Bienenstock, NP  insulin aspart (NOVOLOG) 100 UNIT/ML injection Inject 0-9 Units into the skin every 4 (four) hours. 08/17/19   Bradly Bienenstock, NP  insulin glargine (LANTUS) 100 UNIT/ML injection Inject 0.06 mLs (6 Units total) into the skin at bedtime. 08/17/19   Bradly Bienenstock, NP  lidocaine (XYLOCAINE) 2 % solution Use as directed 15 mLs in the mouth or throat every 8 (eight) hours as needed for mouth pain. 08/17/19   Bradly Bienenstock, NP  lip balm (BLISTEX) OINT Apply 1 application topically as needed for lip care. 08/17/19   Bradly Bienenstock, NP  menthol-cetylpyridinium (CEPACOL) 3 MG lozenge Take 1 lozenge (3 mg total) by mouth every 2 (two) hours as needed for sore throat. 08/17/19   Bradly Bienenstock, NP    methylPREDNISolone sodium succinate (SOLU-MEDROL) 40 mg/mL injection Inject 1 mL (40 mg total) into the vein daily. 08/17/19   Bradly Bienenstock, NP  ondansetron (ZOFRAN) 4 MG/2ML SOLN injection Inject 2 mLs (4 mg total) into the vein every 6 (six) hours as needed for nausea. 08/17/19   Bradly Bienenstock, NP  phenol (CHLORASEPTIC) 1.4 % LIQD Use as directed 1 spray in the mouth or throat as needed for throat irritation / pain. 08/17/19   Bradly Bienenstock, NP  sennosides (SENOKOT) 8.8 MG/5ML syrup Place 5 mLs into feeding tube 2 (two) times daily as needed for mild constipation. 08/17/19   Darel Hong D, NP  sodium chloride flush (NS) 0.9 % SOLN Inject 3 mLs into the vein every 12 (twelve) hours. 08/17/19   Darel Hong D, NP  sodium chloride flush (NS) 0.9 % SOLN Inject 3 mLs into the vein as needed. 08/17/19   Bradly Bienenstock, NP     No family history on file.  Social History   Socioeconomic History  . Marital status: Married    Spouse name: Not on file  . Number of children: Not on file  . Years of education: Not on file  . Highest education level: Not on file  Occupational History  . Not on file  Tobacco Use  . Smoking status: Never Smoker  . Smokeless tobacco: Never Used  Substance and Sexual Activity  . Alcohol use: Never  . Drug use: Never  . Sexual activity: Not on file  Other Topics Concern  . 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 Minimal english   Social Determinants of Health   Financial Resource Strain:   . Difficulty of Paying Living Expenses: Not on file  Food Insecurity:   . Worried About Charity fundraiser in the Last Year: Not on file  . Ran Out of Food in the Last Year: Not on file  Transportation Needs:   . Lack of Transportation (Medical): Not on file  . Lack of Transportation (Non-Medical): Not on file  Physical Activity:   . Days of Exercise per Week: Not on file  . Minutes of  Exercise per Session: Not on file  Stress:   . Feeling of Stress : Not on file  Social Connections:   . Frequency of Communication with Friends and Family: Not on file  . Frequency of Social Gatherings with Friends and Family: Not on file  . Attends Religious Services: Not on file  . Active Member of Clubs or Organizations: Not on file  . Attends Archivist Meetings: Not on file  . Marital Status: Not on file    Review of Systems: A 12 point ROS discussed and pertinent positives are indicated in the HPI above.  All other systems are negative.  Review of Systems  Constitutional: Positive for activity change, appetite change, fatigue and unexpected weight change. Negative for fever.  HENT: Negative for trouble swallowing.   Respiratory: Positive for cough. Negative for shortness of breath.   Cardiovascular: Negative for chest pain.  Gastrointestinal: Negative for abdominal pain.  Musculoskeletal: Positive for myalgias. Negative  for back pain.  Neurological: Positive for weakness. Negative for dizziness, speech difficulty and light-headedness.  Psychiatric/Behavioral: Negative for behavioral problems and confusion.    Vital Signs: BP 102/65   Pulse 76   Temp 98.5 F (36.9 C) (Oral)   Resp 13   Wt 112 lb 7 oz (51 kg)   SpO2 96%   BMI 24.33 kg/m   Physical Exam Vitals reviewed.  Cardiovascular:     Rate and Rhythm: Normal rate and regular rhythm.     Heart sounds: Normal heart sounds.  Pulmonary:     Effort: Pulmonary effort is normal.     Breath sounds: Normal breath sounds.  Abdominal:     Palpations: Abdomen is soft.  Musculoskeletal:        General: Normal range of motion.  Skin:    General: Skin is warm and dry.  Neurological:     Mental Status: She is alert and oriented to person, place, and time.     Imaging: CT ANGIO CHEST PE W OR WO CONTRAST  Result Date: 08/17/2019 CLINICAL DATA:  Hemoptysis, status post bronchial artery embolization EXAM: CT  ANGIOGRAPHY CHEST WITH CONTRAST TECHNIQUE: Multidetector CT imaging of the chest was performed using the standard protocol during bolus administration of intravenous contrast. Multiplanar CT image reconstructions and MIPs were obtained to evaluate the vascular anatomy. CONTRAST:  6m OMNIPAQUE IOHEXOL 350 MG/ML SOLN COMPARISON:  08/12/2019 FINDINGS: Cardiovascular: Satisfactory opacification of the pulmonary arteries to the segmental level. No evidence of pulmonary embolism. Normal heart size. No pericardial effusion. Coil or embolic material present in left-sided bronchial and intercostal arteries. Mediastinum/Nodes: No enlarged mediastinal, hilar, or axillary lymph nodes. Thyroid gland and esophagus demonstrate no significant findings. Lungs/Pleura: Adherent debris or secretions in the left mainstem bronchus (series 6, image 101). Significant improvement in aeration of the bilateral lung bases, with resolution of previously seen atelectasis or consolidation. Minimal persistent bandlike scarring or atelectasis. Generally improved nodular airspace opacity throughout the lungs, with some persistent, scattered ground-glass and heterogeneous opacity of the left upper lobe (series 7, image 30). Upper Abdomen: No acute abnormality. Musculoskeletal: No chest wall abnormality. No acute or significant osseous findings. Review of the MIP images confirms the above findings. IMPRESSION: 1. Negative examination for pulmonary embolism. 2. Significant improvement in aeration of the bilateral lung bases, with resolution of previously seen atelectasis or consolidation. 3. Generally improved nodular airspace opacity throughout the lungs, with some persistent, scattered ground-glass and heterogeneous opacity of the left upper lobe. 4. Adherent debris or secretions in the left mainstem bronchus. 5. Coil or embolic material present in left-sided bronchial and intercostal arteries. Electronically Signed   By: AEddie CandleM.D.   On:  08/17/2019 16:21   CT ANGIO CHEST PE W OR WO CONTRAST  Result Date: 08/12/2019 CLINICAL DATA:  Hemoptysis. EXAM: CT ANGIOGRAPHY CHEST WITH CONTRAST TECHNIQUE: Multidetector CT imaging of the chest was performed using the standard protocol during bolus administration of intravenous contrast. Multiplanar CT image reconstructions and MIPs were obtained to evaluate the vascular anatomy. CONTRAST:  627mOMNIPAQUE IOHEXOL 350 MG/ML SOLN COMPARISON:  None. FINDINGS: Cardiovascular: Satisfactory opacification of the pulmonary arteries to the segmental level. No evidence of pulmonary embolism. Normal heart size. No pericardial effusion. Mediastinum/Nodes: Endotracheal tube is in grossly good position. Aspirated material is noted in the distal trachea and bilateral bronchi. No adenopathy is noted. Thyroid gland is unremarkable. Lungs/Pleura: No pneumothorax or pleural effusion is noted. Bilateral posterior basilar opacities are noted concerning for atelectasis or aspiration  pneumonia. Left upper lobe opacity is also noted concerning for pneumonia. Upper Abdomen: No acute abnormality. Musculoskeletal: No chest wall abnormality. No acute or significant osseous findings. Review of the MIP images confirms the above findings. IMPRESSION: 1. No definite evidence of pulmonary embolus. 2. Aspirated material is noted in the distal trachea and bilateral bronchi. 3. Bilateral posterior basilar opacities are noted concerning for atelectasis or aspiration pneumonia. Left upper lobe opacity is also noted concerning for pneumonia. Electronically Signed   By: Marijo Conception M.D.   On: 08/12/2019 18:40   PERIPHERAL VASCULAR CATHETERIZATION  Result Date: 08/15/2019 See op note  DG Chest Port 1 View  Result Date: 08/17/2019 CLINICAL DATA:  Hemoptysis EXAM: PORTABLE CHEST 1 VIEW COMPARISON:  08/13/2019 FINDINGS: The patient has been extubated. There are faint opacities in the left upper lobe. There are new embolization coils projecting  over the aortic arch. There is no pneumothorax. No large pleural effusion. No acute osseous abnormality. IMPRESSION: 1. Status post extubation. 2. Faint densities in the left upper lobe, likely related to pulmonary hemorrhage as seen on the patient's prior CT PE study. 3. New embolization coils project over the aortic arch. Electronically Signed   By: Constance Holster M.D.   On: 08/17/2019 01:02   DG Chest Port 1 View  Result Date: 08/13/2019 CLINICAL DATA:  Endotracheal tube. EXAM: PORTABLE CHEST 1 VIEW COMPARISON:  March 10, 2019. FINDINGS: The heart size and mediastinal contours are within normal limits. Endotracheal tube is seen in grossly good position. No pneumothorax or pleural effusion is noted. Both lungs are clear. The visualized skeletal structures are unremarkable. IMPRESSION: Endotracheal tube in grossly good position. No acute cardiopulmonary abnormality seen. Electronically Signed   By: Marijo Conception M.D.   On: 08/13/2019 08:53   US LIVER DOPPLER  Result Date: 08/12/2019 CLINICAL DATA:  Elevated LFTs EXAM: DUPLEX ULTRASOUND OF LIVER TECHNIQUE: Color and duplex Doppler ultrasound was performed to evaluate the hepatic in-flow and out-flow vessels. COMPARISON:  None. FINDINGS: Main Portal Vein size: 1 cm Portal Vein Velocities (all hepatopetal): Main Prox:  27 cm/sec Main Mid: 25 cm/sec Main Dist:  22 cm/sec Right: 15 cm/sec Left: 15 cm/sec Hepatic Vein Velocities (all hepatofugal): Right:  23 cm/sec Middle:  18 cm/sec Left:  25 cm/sec IVC: Present and patent with normal respiratory phasicity. Velocity 76 cm/sec. Hepatic Artery Velocity:  55 cm/sec Splenic Vein Velocity:  14 cm/sec Spleen: 8.7 cm x 10.9 cm x 4.2 cm with a total volume of 209 cm^3 (411 cm^3 is upper limit normal) Portal Vein Occlusion/Thrombus: No Splenic Vein Occlusion/Thrombus: No Ascites: None Varices: None IMPRESSION: 1. Unremarkable hepatic vascular Doppler evaluation. Electronically Signed   By: Lucrezia Europe M.D.   On:  08/12/2019 13:19   US ABDOMEN LIMITED RUQ  Addendum Date: 08/12/2019   ADDENDUM REPORT: 08/12/2019 17:45 ADDENDUM: These results will be called to the ordering clinician or representative by the Radiologist Assistant, and communication documented in the PACS or zVision Dashboard. Electronically Signed   By: Zetta Bills M.D.   On: 08/12/2019 17:45   Result Date: 08/12/2019 CLINICAL DATA:  Elevated liver enzymes vomiting for 1 day EXAM: ULTRASOUND ABDOMEN LIMITED RIGHT UPPER QUADRANT COMPARISON:  None. FINDINGS: Gallbladder: No gallstones or wall thickening visualized. No sonographic Murphy sign noted by sonographer. Common bile duct: Diameter: 4 mm Liver: Moderately heterogeneous hepatic echotexture, suggestion of physeal widening and nodular contour. Area of variable hepatic echogenicity measuring approximately 3.5 x 3.6 cm in the anterior right hepatic  lobe (image 33 of 35. Also suggested on other images along the medial aspect of the right hemi liver, perhaps in subsegment V. on some images geographic increased echogenicity is displayed. Portal vein is patent on color Doppler imaging with normal direction of blood flow towards the liver. Other: No ascites. IMPRESSION: 1. Signs of cirrhosis with possible focal hepatic lesion versus is area of fatty infiltration. Multiphase imaging with CT or MR is suggested on follow-up. 2. No signs of cholecystitis or biliary calculi. Electronically Signed: By: Zetta Bills M.D. On: 08/12/2019 12:19    Labs:  CBC: Recent Labs    08/16/19 0347 08/16/19 0347 08/17/19 0511 08/17/19 1447 08/18/19 0306 08/18/19 0438  WBC 9.0  --  9.6 6.5 10.3  --   HGB 12.3   < > 12.7 13.4 11.9* 11.2*  HCT 35.1*   < > 35.6* 37.8 34.0* 33.0*  PLT 117*  --  124* 127* 143*  --    < > = values in this interval not displayed.    COAGS: Recent Labs    08/12/19 0820 08/12/19 1352 08/15/19 0730 08/17/19 0511 08/17/19 1447  INR 1.0  --  1.2 1.1 1.1  APTT  --  33  --  30 28     BMP: Recent Labs    08/16/19 0347 08/16/19 0347 08/17/19 0511 08/17/19 1447 08/18/19 0306 08/18/19 0438  NA 140   < > 139 135 138 141  K 3.2*   < > 3.2* 4.6 3.3* 3.2*  CL 107  --  103 102 104  --   CO2 27  --  26 22 24   --   GLUCOSE 130*  --  153* 356* 119*  --   BUN 12  --  13 13 13   --   CALCIUM 8.5*  --  8.9 9.0 8.9  --   CREATININE 0.38*  --  0.37* 0.53 0.52  --   GFRNONAA >60  --  >60 >60 >60  --   GFRAA >60  --  >60 >60 >60  --    < > = values in this interval not displayed.    LIVER FUNCTION TESTS: Recent Labs    03/10/19 1431 03/10/19 1431 08/12/19 0820 08/13/19 0509 08/14/19 0456 08/17/19 1447  BILITOT 0.6  --  0.7 0.6  --  0.6  AST 77*  --  79* 86*  --  135*  ALT 78*  --  109* 95*  --  140*  ALKPHOS 199*  --  174* 110  --  141*  PROT 8.7*  --  8.4* 7.5  --  7.2  ALBUMIN 3.8   < > 4.3 3.6 3.2* 3.6   < > = values in this interval not displayed.    TUMOR MARKERS: No results for input(s): AFPTM, CEA, CA199, CHROMGRNA in the last 8760 hours.  Assessment and Plan:  Massive hemoptysis 08/12/19 Pulmonary arteriogram and bronchial artery embolization at [facility] with Vasc Surgery. To [facility] with refractory hemoptysis CTA yesterday revealing 1. Negative examination for pulmonary embolism. 2. Significant improvement in aeration of the bilateral lung bases, with resolution of previously seen atelectasis or consolidation. 3. Generally improved nodular airspace opacity throughout the lungs, with some persistent, scattered ground-glass and heterogeneous opacity of the left upper lobe. 4. Adherent debris or secretions in the left mainstem bronchus. 5. Coil or embolic material present in left-sided bronchial and intercostal arteries.  Dr Pascal Lux aware of pt and admission He has reviewed imaging and I have seen and  examined pt. Coils in left sided bronchial and intercostal arteries does limit IR abilities for additional treatment. Would suggest consideration of re  bronchoscopy if bleeding were to recur to enhance IR chances at directing embolization if would need. Pt is stable and no further bleeding now since last pm IR is aware of pt and would be happy to help if needed.  Pt is aware that we know she is here and could be enlisted to help with her treatment if MDs felt necessary. She is agreeable  Thank you for this interesting consult.  I greatly enjoyed meeting Vickie Smith and look forward to participating in their care.  A copy of this report was sent to the requesting provider on this date.  Electronically Signed: Lavonia Drafts, PA-C 08/18/2019, 11:29 AM   I spent a total of 40 Minutes    in face to face in clinical consultation, greater than 50% of which was counseling/coordinating care for hemoptysis with embolization at [facility]

## 2019-08-18 NOTE — Progress Notes (Signed)
PT with episode of hemoptysis while at bedside. 02 stable on room air. All other vitals stable. Estimated hemoptysis is 15 mL. E-link MD made aware.

## 2019-08-18 NOTE — Progress Notes (Signed)
[facility] ADULT ICU REPLACEMENT PROTOCOL FOR AM LAB REPLACEMENT ONLY  The patient does apply for the [facility] Adult ICU Electrolyte Replacment Protocol based on the criteria listed below:   1. Is GFR >/= 40 ml/min? Yes.    Patient's GFR today is >60 2. Is urine output >/= 0.5 ml/kg/hr for the last 6 hours? Yes.   Patient's UOP is 0.9 ml/kg/hr 3. Is BUN < 60 mg/dL? Yes.    Patient's BUN today is 13 4. Abnormal electrolyte(s): k 3.3 5. Ordered repletion with: protocol 6. If a panic level lab has been reported, has the CCM MD in charge been notified? No..   Physician:    Ronda Fairly A 08/18/2019 6:15 AM

## 2019-08-18 NOTE — Progress Notes (Signed)
Inpatient Diabetes Program Recommendations  AACE/ADA: New Consensus Statement on Inpatient Glycemic Control (2015)  Target Ranges:  Prepandial:   less than 140 mg/dL      Peak postprandial:   less than 180 mg/dL (1-2 hours)      Critically ill patients:  140 - 180 mg/dL   Lab Results  Component Value Date   GLUCAP 385 (H) 08/18/2019   HGBA1C 11.0 (H) 08/17/2019    Review of Glycemic Control Results for Vickie Smith, Vickie Smith (MRN [id number]) as of 08/18/2019 11:16  Ref. Range 08/17/2019 15:43 08/17/2019 19:46 08/18/2019 00:00 08/18/2019 03:48 08/18/2019 07:53  Glucose-Capillary Latest Ref Range: 70 - 99 mg/dL 323 (H) 259 (H) 225 (H) 101 (H) 157 (H)   Diabetes history: DM 2 Outpatient Diabetes medications:  None-Has taken Metformin in the past per record Review Current orders for Inpatient glycemic control:  Novolog resistant q 4 hours Lantus 10 units q HS Solumedrol 60 mg IV q 12 hours Inpatient Diabetes Program Recommendations:   Please consider increasing Lantus to 15 units q HS.  Also please add Novolog meal coverage 4 units tid with meals (hold if patient eats less than 50% or NPO).  Thanks  Adah Perl, RN, BC-ADM Inpatient Diabetes Coordinator Pager [phone] (8a-5p)

## 2019-08-19 ENCOUNTER — Inpatient Hospital Stay (HOSPITAL_COMMUNITY): Payer: Self-pay

## 2019-08-19 DIAGNOSIS — J029 Acute pharyngitis, unspecified: Secondary | ICD-10-CM

## 2019-08-19 LAB — COMPREHENSIVE METABOLIC PANEL
ALT: 100 U/L — ABNORMAL HIGH (ref 0–44)
AST: 56 U/L — ABNORMAL HIGH (ref 15–41)
Albumin: 3.4 g/dL — ABNORMAL LOW (ref 3.5–5.0)
Alkaline Phosphatase: 120 U/L (ref 38–126)
Anion gap: 10 (ref 5–15)
BUN: 14 mg/dL (ref 6–20)
CO2: 24 mmol/L (ref 22–32)
Calcium: 9.1 mg/dL (ref 8.9–10.3)
Chloride: 102 mmol/L (ref 98–111)
Creatinine, Ser: 0.44 mg/dL (ref 0.44–1.00)
GFR calc Af Amer: >60 mL/min (ref >60)
GFR calc non Af Amer: >60 mL/min (ref >60)
Glucose, Bld: 277 mg/dL — ABNORMAL HIGH (ref 70–99)
Potassium: 4 mmol/L (ref 3.5–5.1)
Sodium: 136 mmol/L (ref 135–145)
Total Bilirubin: 0.6 mg/dL (ref 0.3–1.2)
Total Protein: 7.1 g/dL (ref 6.5–8.1)

## 2019-08-19 LAB — MPO/PR-3 (ANCA) ANTIBODIES
ANCA Proteinase 3: <3.5 U/mL (ref 0.0–3.5)
Myeloperoxidase Abs: <9 U/mL (ref 0.0–9.0)

## 2019-08-19 LAB — CBC
HCT: 34.5 % — ABNORMAL LOW (ref 36.0–46.0)
Hemoglobin: 12.1 g/dL (ref 12.0–15.0)
MCH: 31.8 pg (ref 26.0–34.0)
MCHC: 35.1 g/dL (ref 30.0–36.0)
MCV: 90.8 fL (ref 80.0–100.0)
Platelets: 146 10*3/uL — ABNORMAL LOW (ref 150–400)
RBC: 3.8 MIL/uL — ABNORMAL LOW (ref 3.87–5.11)
RDW: 12.6 % (ref 11.5–15.5)
WBC: 9.5 10*3/uL (ref 4.0–10.5)
nRBC: 0 % (ref 0.0–0.2)

## 2019-08-19 LAB — ANCA TITERS
Atypical P-ANCA titer: <1:20 {titer}
C-ANCA: <1:20 {titer}
P-ANCA: <1:20 {titer}

## 2019-08-19 LAB — HCV INTERPRETATION

## 2019-08-19 LAB — GLUCOSE, CAPILLARY
Glucose-Capillary: 335 mg/dL — ABNORMAL HIGH (ref 70–99)
Glucose-Capillary: 280 mg/dL — ABNORMAL HIGH (ref 70–99)
Glucose-Capillary: 335 mg/dL — ABNORMAL HIGH (ref 70–99)
Glucose-Capillary: 264 mg/dL — ABNORMAL HIGH (ref 70–99)

## 2019-08-19 LAB — HCV AB W REFLEX TO QUANT PCR: HCV Ab: <0.1 s/co ratio (ref 0.0–0.9)

## 2019-08-19 LAB — HEPATITIS B E ANTIBODY: Hep B E Ab: NEGATIVE

## 2019-08-19 MED ORDER — PREDNISONE 20 MG PO TABS
40.0000 mg | ORAL_TABLET | Freq: Every day | ORAL | Status: DC
  Administered 2019-08-20: 40 mg via ORAL
  Filled 2019-08-19: qty 2

## 2019-08-19 MED ORDER — PHENOL 1.4 % MT LIQD
1.0000 | OROMUCOSAL | Status: AC | PRN
  Administered 2019-08-19: 1 via OROMUCOSAL
  Filled 2019-08-19: qty 177

## 2019-08-19 MED ORDER — LEVOFLOXACIN 500 MG PO TABS
500.0000 mg | ORAL_TABLET | Freq: Every day | ORAL | Status: AC
  Administered 2019-08-20: 11:00:00 500 mg via ORAL
  Filled 2019-08-19: qty 1

## 2019-08-19 MED ORDER — MENTHOL 3 MG MT LOZG
1.0000 | LOZENGE | Freq: Two times a day (BID) | OROMUCOSAL | Status: DC
  Administered 2019-08-19 – 2019-08-20 (×3): 3 mg via ORAL
  Filled 2019-08-19 (×3): qty 9

## 2019-08-19 NOTE — Progress Notes (Signed)
Inpatient Diabetes Program Recommendations  AACE/ADA: New Consensus Statement on Inpatient Glycemic Control (2015)  Target Ranges:  Prepandial:   less than 140 mg/dL      Peak postprandial:   less than 180 mg/dL (1-2 hours)      Critically ill patients:  140 - 180 mg/dL   Lab Results  Component Value Date   GLUCAP 280 (H) 08/19/2019   HGBA1C 11.0 (H) 08/17/2019    Review of Glycemic Control  Diabetes history: DM 2 Outpatient Diabetes medications:  None-Has taken Metformin in the past per record Review Current orders for Inpatient glycemic control:  Novolog resistant q 4 hours Lantus 10 units q HS Prednisone 40 mg to start in am  Inpatient Diabetes Program Recommendations:   Please consider increasing Lantus to 15 units q HS.   Thank you, Nani Gasser. Vertie Dibbern, RN, MSN, CDE  Diabetes Coordinator Inpatient Glycemic Control Team Team Pager [phone] (8am-5pm) 08/19/2019 3:16 PM

## 2019-08-19 NOTE — Progress Notes (Signed)
eLink Physician-Brief Progress Note Patient Name: Anyely Cunning DOB: Aug 03, 1965 MRN: [id number]   Date of Service  08/19/2019  HPI/Events of Note  Sorethroat  eICU Interventions  Chloraseptic spray 1 squirt PRN sore throat x next 24 hours.        Lizvette Lightsey U Brianah Hopson 08/19/2019, 1:10 AM

## 2019-08-19 NOTE — Progress Notes (Signed)
PULMONARY / CRITICAL CARE MEDICINE   NAME:  Vickie Smith, MRN:  [id number], DOB:  Nov 29, 1965, LOS: 2 ADMISSION DATE:  08/17/2019 CHIEF COMPLAINT: Recurrent hemoptysis REFERRING PROVIDER: Dr. Lanney Gins, [facility]  BRIEF HISTORY:    54 yo female immigrant from [place] presented to [facility] with hemoptysis and hypoxia.  Also found to have thrombocytopenia, hepatic cirrhosis, and diabetes.  Required intubation and bronchoscopy with localization of bleeding to LUL.  She had pulmonary embolization by IR with improved symptoms.  Developed recurrent hemoptysis 2/07 and transferred to [facility]. Marland Kitchen SIGNIFICANT PAST MEDICAL HISTORY   COVID 19 in August 2020, DM type II  SIGNIFICANT EVENTS:  2/02 Admit to [facility], intubated 2/03 Extubated 2/05 embolization to Lt upper lobe 2/07 recurrent hemoptysis >> transfer to [facility] 02/08 - Not having cough.  Denies chest pain, nausea. 2/9 - in floor bed  STUDIES:   Abd u/s 2/02 >> cirrhosis Liver doppler 2/02 >> negative CT angio chest 2/02 >> b/l basilar opacities, LUL opacity EGD 2/02 >> portal hypertensive gastropathy Serology 2/04 >> ANA positive CT angio chest 2/07 >> secretions in Lt main bronchus, improved aeration, decreased GGO LUL  CULTURES:  SARS CoV2 PCR 2/02 >> negative Influenza PCR 2/02 >> negative BAL 2/02 >> negative BAL AFB 2/02 >> smear negative >>  Sputum AFB 2/05 >> smear negative >>   ANTIBIOTICS:  Rocephin 2/02 >> 2/06 Zithromax 2/02 >> 2/06  Levaquin 2/07 >>   LINES/TUBES:  ETT 2/02 >> 2/03  CONSULTANTS:  IR Vascular surgery  SUBJECTIVE:   08/19/2019- in bed 6n19. Having lunch. On room air. Sitting side of bed. Reports through RN and limited english c/o sore throat. No stridor per RN. No further hemoptysis  OBJECTIVE:   CONSTITUTIONAL: BP 96/70 (BP Location: Left Arm)   Pulse 64   Temp 99.6 F (37.6 C) (Oral)   Resp 20   Ht _0  (1.448 m)   Wt 51 kg   SpO2 99%   BMI 24.33 kg/m   I/O last 3 completed shifts: In: 1914.9  [P.O.:560; I.V.:251.7; IV Piggyback:1103.3] Out: 721 [Urine:704; Other:15; Stool:2]   PHYSICAL EXAM: General Appearance:  Looks welll. Sittiing and eating Head:  Normocephalic, without obvious abnormality, atraumatic Eyes:  PERRL - yes, conjunctiva/corneas - muddy     Ears:  Normal external ear canals, both ears Nose:  G tube - no Throat:  ETT TUBE - no , OG tube - no. HOARSE FLUCTUANT VOICE Neck:  Supple,  No enlargement/tenderness/nodules Lungs: Clear to auscultation bilaterally, Heart:  S1 and S2 normal, no murmur, CVP - no.  Pressors - no Abdomen:  Soft, no masses, no organomegaly Genitalia / Rectal:  Not done Extremities:  Extremities- intact Skin:  ntact in exposed areas . Sacral area - not examined Neurologic:  Sedation - none -> RASS - +1 . Moves all 4s - yes. CAM-ICU - neg . 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 No stridor  Plan  - monitor 1 more day in hospita; - cepacol lozenge - might need ENT eval for any vocal cord issues  DM type II poorly controlled with steroid induced hyperglycemia. - SSI with lantus   Best Practice / Goals of Care / Disposition.   DVT PROPHYLAXIS: SCDs SUP: not indicated NUTRITION: carb modified MOBILITY: as tolerated GOALS OF CARE: Full code DISPOSITION: med surg. Aim for dc 08/20/19      SIGNATURE    Dr. Brand Males,  M.D., F.C.C.P,  Pulmonary and Critical Care Medicine Staff Physician, [place] Director - Interstitial Lung Disease  Program  Pulmonary [place] [facility] [place], [place], [address]  Pager: [phone], If no answer or between  15:00h - 7:00h: call [phone] Telephone: [phone]  2:10 PM 08/19/2019    [place]  Lab 08/13/19 0421 08/18/19 0438  PHART 7.38 7.445  PCO2ART 37 39.5  PO2ART 268* 76.0*  HCO3 21.9 27.1  TCO2  --  28  O2SAT 99.9 96.0    CBC Recent Labs  Lab 08/17/19 1447 08/17/19 1447 08/18/19 0306 08/18/19 0438 08/19/19 0216  HGB 13.4   < > 11.9* 11.2* 12.1  HCT 37.8   < > 34.0* 33.0* 34.5*  WBC 6.5  --  10.3  --  9.5  PLT 127*  --  143*  --  146*   < > = values in this interval not displayed.    COAGULATION Recent Labs  Lab 08/15/19 0730 08/17/19 0511 08/17/19 1447  INR 1.2 1.1 1.1    CARDIAC  No results for input(s): TROPONINI in the last 168 hours. No results for input(s): PROBNP in the last 168 hours.   CHEMISTRY Recent Labs  Lab 08/14/19 0456 08/15/19 0730 08/16/19 0347 08/16/19 0347 08/17/19 0511 08/17/19 0511 08/17/19 1447 08/17/19 1447 08/18/19 0306 08/18/19 0306 08/18/19 0438 08/19/19 0216  NA 139   < > 140   < > 139  --  135  --  138  --  141 136  K 3.9   < > 3.2*   < > 3.2*   < > 4.6   < > 3.3*   < > 3.2* 4.0  CL 107   < > 107  --  103  --  102  --  104  --   --  102  CO2 24   < > 27  --  26  --  22  --  24  --   --  24  GLUCOSE 207*   < > 130*  --  153*  --  356*  --  119*  --   --  277*  BUN 13   < > 12  --  13  --  13  --  13  --   --  14  CREATININE 0.32*   < > 0.38*  --  0.37*  --  0.53  --  0.52  --   --  0.44  CALCIUM 8.5*   < > 8.5*  --  8.9  --  9.0  --  8.9  --   --  9.1  MG 2.2  --   --   --   --   --  2.1  --  2.1  --   --   --   PHOS 3.2  --   --   --   --   --  4.1  --   --   --   --   --    < > = values in this  interval not displayed.   Estimated Creatinine Clearance: 56 mL/min (by C-G formula based on SCr of 0.44 mg/dL).   LIVER Recent Labs  Lab 08/13/19 0509 08/14/19 0456 08/15/19 0730 08/17/19 0511 08/17/19 1447 08/19/19 0216  AST 86*  --   --   --  135* 56*  ALT 95*  --   --   --  140* 100*  ALKPHOS 110  --   --   --  141* 120  BILITOT 0.6  --   --   --  0.6 0.6  PROT 7.5  --   --   --  7.2 7.1  ALBUMIN 3.6 3.2*  --   --  3.6 3.4*  INR  --   --  1.2 1.1 1.1  --      INFECTIOUS Recent Labs  Lab 08/17/19 1447 08/17/19 1815  LATICACIDVEN 1.5 2.6*  PROCALCITON <0.10  --      ENDOCRINE CBG (last 3)  Recent Labs    08/18/19 2055 08/19/19 0729 08/19/19 1142  GLUCAP 327* 335* 280*         IMAGING x48h  - image(s) personally visualized  -   highlighted in bold DG Chest 2 View  Result Date: 08/19/2019 CLINICAL DATA:  Recurrent hemoptysis. Left chest pain. EXAM: CHEST - 2 VIEW COMPARISON:  08/17/2019 FINDINGS: Normal sized heart. Clear lungs. Stable embolization coils posteriorly at the level of the superior aspect of the aortic arch. Unremarkable bones. IMPRESSION: No acute abnormality. Electronically Signed   By: Claudie Revering M.D.   On: 08/19/2019 09:57   CT ANGIO CHEST PE W OR WO CONTRAST  Result Date: 08/17/2019 CLINICAL DATA:  Hemoptysis, status post bronchial artery embolization EXAM: CT ANGIOGRAPHY CHEST WITH CONTRAST TECHNIQUE: Multidetector CT imaging of the chest was performed using the standard protocol during bolus administration of intravenous contrast. Multiplanar CT image reconstructions and MIPs were obtained to evaluate the vascular anatomy. CONTRAST:  80m OMNIPAQUE IOHEXOL 350 MG/ML SOLN COMPARISON:  08/12/2019 FINDINGS: Cardiovascular: Satisfactory opacification of the pulmonary arteries to the segmental level. No evidence of pulmonary embolism. Normal heart size. No pericardial effusion. Coil or embolic material present in left-sided bronchial and  intercostal arteries. Mediastinum/Nodes: No enlarged mediastinal, hilar, or axillary lymph nodes. Thyroid gland and esophagus demonstrate no significant findings. Lungs/Pleura: Adherent debris or secretions in the left mainstem bronchus (series 6, image 101). Significant improvement in aeration of the bilateral lung bases, with resolution of previously seen atelectasis or consolidation. Minimal persistent bandlike scarring or atelectasis. Generally improved nodular airspace opacity throughout the lungs, with some persistent, scattered ground-glass and heterogeneous opacity of the left upper lobe (series 7, image 30). Upper Abdomen: No acute abnormality. Musculoskeletal: No chest wall abnormality. No acute or significant osseous findings. Review of the MIP images confirms the above findings. IMPRESSION: 1. Negative examination for pulmonary embolism. 2. Significant improvement in aeration of the bilateral lung bases, with resolution of previously seen atelectasis or consolidation. 3. Generally improved nodular airspace opacity throughout the lungs, with some persistent, scattered ground-glass and heterogeneous opacity of the left upper lobe. 4. Adherent debris or secretions in the left mainstem bronchus. 5. Coil or embolic material present in left-sided bronchial and  intercostal arteries. Electronically Signed   By: Eddie Candle M.D.   On: 08/17/2019 16:21

## 2019-08-20 ENCOUNTER — Encounter: Payer: Self-pay | Admitting: Pulmonary Disease

## 2019-08-20 DIAGNOSIS — J029 Acute pharyngitis, unspecified: Secondary | ICD-10-CM

## 2019-08-20 LAB — ACID FAST SMEAR (AFB, MYCOBACTERIA): Acid Fast Smear: NEGATIVE

## 2019-08-20 LAB — ALPHA-1 ANTITRYPSIN PHENOTYPE: A-1 Antitrypsin, Ser: 128 mg/dL (ref 101–187)

## 2019-08-20 LAB — GLUCOSE, CAPILLARY
Glucose-Capillary: 128 mg/dL — ABNORMAL HIGH (ref 70–99)
Glucose-Capillary: 328 mg/dL — ABNORMAL HIGH (ref 70–99)

## 2019-08-20 MED ORDER — INSULIN PEN NEEDLE 32G X 4 MM MISC: 1.0000 | Freq: Every day | 1 refills | Status: AC

## 2019-08-20 MED ORDER — PREDNISONE 20 MG PO TABS: ORAL_TABLET | ORAL | 0 refills | Status: AC

## 2019-08-20 MED ORDER — METFORMIN HCL 500 MG PO TABS: 500.0000 mg | ORAL_TABLET | Freq: Two times a day (BID) | ORAL | 0 refills | Status: AC

## 2019-08-20 MED ORDER — MENTHOL 3 MG MT LOZG: 1.0000 | LOZENGE | Freq: Two times a day (BID) | OROMUCOSAL | 12 refills | Status: AC

## 2019-08-20 MED ORDER — BASAGLAR KWIKPEN 100 UNIT/ML ~~LOC~~ SOPN: 8.0000 [IU] | PEN_INJECTOR | Freq: Every day | SUBCUTANEOUS | 0 refills | Status: AC

## 2019-08-20 MED FILL — metFORMIN HCL 500 MG TABS: 500 | 30 days supply | Qty: 60 | Fill #0

## 2019-08-20 MED FILL — BASAGLAR 100 UNIT/ML KWIKPE: 100 | 28 days supply | Qty: 3 | Fill #0

## 2019-08-20 MED FILL — predniSONE 20 MG TABS: 20 | 5 days supply | Qty: 7 | Fill #0

## 2019-08-20 MED FILL — PENTIPS 32G X 4 MM MISC: 32G X 4 MM | 30 days supply | Qty: 100 | Fill #0

## 2019-08-20 NOTE — Discharge Summary (Addendum)
Physician Discharge Summary  Patient ID: Vickie Smith MRN: [id number] DOB/AGE: 02-05-1966 54 y.o.  Admit date: 08/17/2019 Discharge date: 08/20/2019    Discharge Diagnoses:  Recurrent Hemoptysis  Cirrhosis with Portal Hypertension  Thrombocytopenia  ANA Positive Sore Throat DM II - new diagnosis Steroid Induced Hyperglycemia                                                                      DISCHARGE PLAN BY DIAGNOSIS      Recurrent Hemoptysis  Aspiration Pneumonia  ANA positive.  LUL isolated as site of bleeding, status post thoracic aortogram on 08/15/2019 with embolization of the left upper lobe bronchial artery (beads and coils). Completed 8 day course of antibiotics while hospitalized.   Discharge Plan: -Follow up with Pulmonary in [place]  -Patient instructed to return to ER if new or worsening bleeding  -Preliminary AFB smear negative, follow up final culture  Cirrhosis with Portal Hypertension  Thrombocytopenia  No history of ETOH, UDS negative. ANA positive, ANCA negative.   Discharge Plan: -Follow up with Dr. Marius Ditch arranged for 2/11 at 1:45 PM.   -Will need further autoimmune evaluation -alpha 1 pending at time of discharge from 2/4  Sore Throat / Hoarseness  Suspect related to irritation from intubation.   Discharge Plan: -Cepacol lozenge for discharge.  Afebrile, no leukocytosis. No thrush on exam.  -pt instructed to use hospital cepacol lozenge & spray as needed or sugar free jolly ranchers for soreness -if hoarseness persists at time of follow up with [facility], she may need ENT evaluation for upper airway inspection.    DM II - new diagnosis Steroid Induced Hyperglycemia   Discharge Plan: -Metformin 500 mg BID  -Basalglar 8 units QD until 2/13, then decrease to 5 units QD -[facility]itions [facility] Care Pharmacy used for discharge for medications                     DISCHARGE SUMMARY   54 y/o F, from Vickie Smith, who presented 2/2 to [facility] with  reports of hemoptysis and found to be hypoxic.  She required intubation and bronchoscopy assessment to identify site of bleeding which was localized to the LUL.  On 2/5 she underwent embolization to the LUL.  Unfortunately, on 2/7 she had recurrent hemoptysis and was transferred to [facility] for further evaluation.  Additional work up revealed she was thrombocytopenic, newly diagnosed diabetic and had hepatic cirrhosis.  Labs notable for ANA positive.    SIGNIFICANT EVENTS          2/02 Admit to [facility], intubated 2/03 Extubated 2/05 embolization to Lt upper lobe 2/07 recurrent hemoptysis >> transfer to [facility] 2/08 Not having cough.  Denies chest pain, nausea. 2/09 On medical floor  SIGNIFICANT DIAGNOSTIC STUDIES Abd u/s 2/02 >> cirrhosis Liver doppler 2/02 >> negative CT angio chest 2/02 >> b/l basilar opacities, LUL opacity EGD 2/02 >> portal hypertensive gastropathy Serology 2/04 >> ANA positive CT angio chest 2/07 >> secretions in Lt main bronchus, improved aeration, decreased GGO LUL  MICRO DATA  SARS CoV2 PCR 2/02 >> negative Influenza PCR 2/02 >> negative BAL 2/02 >> negative BAL AFB 2/02 >> smear negative >>  Sputum AFB 2/05 >> smear negative >>   ANTIBIOTICS  Rocephin 2/02 >> 2/06 Zithromax 2/02 >> 2/06  Levaquin 2/07 >> 2/10  CONSULTS PCCM  TUBES / LINES ETT 2/02 >> 2/03   Discharge Exam: General: small adult female sitting up in bed in NAD HEENT: MM pink/moist, no oral thrush, or lesions  Neuro: AAOx4, speech clear, MAE  CV: s1s2 RRR, no m/r/g PULM:  Non-labored, clear on right, crackles on left posterior GI: soft, bsx4 active  Extremities: warm/dry, no edema  Skin: no rashes or lesions  Vitals:   08/19/19 0922 08/19/19 1515 08/19/19 2023 08/20/19 0542  BP: 96/70 1_0  Pulse: 64 66 64 64  Resp: _1 Temp: 99.6 F (37.6 C) 98.4 F (36.9 C) 98.1 F (36.7 C) 98.2 F (36.8 C)  TempSrc: Oral Oral Oral Oral  SpO2: 99% 100% 99% 100%  Weight:       Height:         Discharge Labs  BMET Recent Labs  Lab 08/14/19 0456 08/15/19 0730 08/16/19 0347 08/16/19 0347 08/17/19 0511 08/17/19 0511 08/17/19 1447 08/17/19 1447 08/18/19 0306 08/18/19 0306 08/18/19 0438 08/19/19 0216  NA 139   < > 140   < > 139  --  135  --  138  --  141 136  K 3.9   < > 3.2*   < > 3.2*   < > 4.6   < > 3.3*   < > 3.2* 4.0  CL 107   < > 107  --  103  --  102  --  104  --   --  102  CO2 24   < > 27  --  26  --  22  --  24  --   --  24  GLUCOSE 207*   < > 130*  --  153*  --  356*  --  119*  --   --  277*  BUN 13   < > 12  --  13  --  13  --  13  --   --  14  CREATININE 0.32*   < > 0.38*  --  0.37*  --  0.53  --  0.52  --   --  0.44  CALCIUM 8.5*   < > 8.5*  --  8.9  --  9.0  --  8.9  --   --  9.1  MG 2.2  --   --   --   --   --  2.1  --  2.1  --   --   --   PHOS 3.2  --   --   --   --   --  4.1  --   --   --   --   --    < > = values in this interval not displayed.    CBC Recent Labs  Lab 08/17/19 1447 08/17/19 1447 08/18/19 0306 08/18/19 0438 08/19/19 0216  HGB 13.4   < > 11.9* 11.2* 12.1  HCT 37.8   < > 34.0* 33.0* 34.5*  WBC 6.5  --  10.3  --  9.5  PLT 127*  --  143*  --  146*   < > = values in this interval not displayed.    Anti-Coagulation Recent Labs  Lab 08/15/19 0730 08/17/19 0511 08/17/19 1447  INR 1.2 1.1 1.1       Follow-up Information    [facility]. Schedule an appointment as soon as possible for  a visit.   Specialty: Primary Care Contact information: [place] [phone]       Tyler Pita, MD Follow up on 09/17/2019.   Specialty: Pulmonary Disease Why: Appt at 9:00 AM.  Please arrive at 8:45 am for check in.  Contact information: [address] [place] [address] [phone]        Lin Landsman, MD Follow up on 08/21/2019.   Specialty: Gastroenterology Why: Follow up with Dr. Marius Ditch at 1:45  PM > go to office at [address] [place], [place] [address].   Contact information: [place] [address] [phone]            Allergies as of 08/20/2019   No Known Allergies     Medication List    STOP taking these medications   alum & mag hydroxide-simeth 200-200-20 MG/5ML suspension Commonly known as: MAALOX/MYLANTA   azithromycin 500 mg in sodium chloride 0.9 % 250 mL   cefTRIAXone 2 g in sodium chloride 0.9 % 100 mL   Chlorhexidine Gluconate Cloth 2 % Pads   chlorpheniramine-HYDROcodone 10-8 MG/5ML Suer Commonly known as: TUSSIONEX   famotidine 20-0.9 MG/50ML-% Commonly known as: PEPCID   feeding supplement (PRO-STAT SUGAR FREE 64) Liqd   insulin aspart 100 UNIT/ML injection Commonly known as: novoLOG   insulin glargine 100 UNIT/ML injection Commonly known as: LANTUS Replaced by: Basaglar KwikPen 100 UNIT/ML Sopn   lidocaine 2 % solution Commonly known as: XYLOCAINE   lip balm Oint   methylPREDNISolone sodium succinate 40 mg/mL injection Commonly known as: SOLU-MEDROL   ondansetron 4 MG/2ML Soln injection Commonly known as: ZOFRAN   phenol 1.4 % Liqd Commonly known as: CHLORASEPTIC   sennosides 8.8 MG/5ML syrup Commonly known as: SENOKOT   sodium chloride flush 0.9 % Soln Commonly known as: NS     TAKE these medications   acetaminophen 325 MG tablet Commonly known as: TYLENOL Take 2 tablets (650 mg total) by mouth every 4 (four) hours as needed for mild pain (temp > 101.5).   Basaglar KwikPen 100 UNIT/ML Sopn Inject 0.08 mLs (8 Units total) into the skin daily. On 2/13 decrease your dose to 5 units daily Replaces: insulin glargine 100 UNIT/ML injection   Insulin Pen Needle 32G X 4 MM Misc Inject 1 Container into the skin daily. Use one new needle daily for insulin injection.   menthol-cetylpyridinium 3 MG lozenge Commonly known as: CEPACOL Take 1 lozenge (3 mg total) by mouth 2 (two) times daily. What  changed:   when to take this  reasons to take this   metFORMIN 500 MG tablet Commonly known as: Glucophage Take 1 tablet (500 mg total) by mouth 2 (two) times daily with a meal.   predniSONE 20 MG tablet Commonly known as: DELTASONE 2 tabs for 2 days, then 1 tab for 2 days, then 1/2 tab for 2 days and stop         Disposition: Home.  Follow up arranged, reviewed with patient.  Work note given to return to work on 2/15 without restrictions.   Discharged Condition: Terresa Marlett has met maximum benefit of inpatient care and is medically stable and cleared for discharge.  Patient is pending follow up as above.      Time spent on disposition:  60 Minutes.  Interpreter utilized for discharge instructions, medication education.    Signed: Noe Gens, NP-C [facility]

## 2019-08-20 NOTE — Discharge Instructions (Signed)
Instrucciones de descarga 1. Llame para un seguimiento con [facility]. Ellos lo ayudarn con el cuidado de su diabetes y lo llevarn a los especialistas que necesite (mdico del hgado y mdico del pulmn). 2. Haga un seguimiento con el mdico del hgado segn lo programado> cita maana 2/11 a la 1:45 PM. [address] [place] 3. Haga un seguimiento con la [facility] segn lo acordado: ella es la doctora de pulmones 4. Para su diabetes -controle su nivel de azcar en la sangre por la maana, el almuerzo y la noche y lleve un registro -su meta de azcar en sangre es 80-180 -si su nivel de azcar en la sangre es bajo / menos de 70, coma una tableta de azcar, un vaso pequeo de jugo de naranja o un refresco con azcar -Tome su pastilla de metformina una vez por la maana y una vez por la noche con Ardelia Mems comida. -Tome 8 unidades de Biochemist, clinical al da. El 13/2 reduzca su dosis a 5 unidades de Biochemist, clinical diarias -intentar comer una dieta baja en carbohidratos 5. Para el sangrado de los [employer], debera ver cada vez menos sangre vieja. Si tiene sangre nueva de color rojo brillante o si aumenta el volumen de sangre, informe a la sala de emergencias de inmediato. 6.Vaya a la sala de emergencias si hay sntomas nuevos o que empeoran 7. Si todava est ronco en la [place], es posible que deba ver a un otorrinolaringlogo Incrementa la actividad lentamente

## 2019-08-20 NOTE — Progress Notes (Addendum)
Inpatient Diabetes Program Recommendations  AACE/ADA: New Consensus Statement on Inpatient Glycemic Control (2015)  Target Ranges:  Prepandial:   less than 140 mg/dL      Peak postprandial:   less than 180 mg/dL (1-2 hours)      Critically ill patients:  140 - 180 mg/dL   Lab Results  Component Value Date   GLUCAP 128 (H) 08/20/2019   HGBA1C 11.0 (H) 08/17/2019    Review of Glycemic Control  A1c 11%  Inpatient Diabetes Program Recommendations:    Basaglar 8 units Q24 hours, Metformin 500 mg bid  Spoke with Noe Gens , NP about pt plan of care. Pt lives in [facility] and will qualify for medication management clinic which [facility].  Basaglar kwikpen (order # [id number]) Insulin pen needles (order # [id number])  Will give pt glucose meter, lancets, lancet device, and strips.  Spoke with pt at bedside with Juliane Lack, NP. Covered discharge instructions in detail. Covered insulin pen operation in addition to how to check her CBG. Discussed A1c level and glucose control in the setting of steroids. Discussed hypoglycemia s/s and treatment.  Thanks,  Tama Headings RN, MSN, BC-ADM Inpatient Diabetes Coordinator Team Pager [phone] (8a-5p)

## 2019-08-21 ENCOUNTER — Ambulatory Visit
Admission: EM | Admit: 2019-08-21 | Discharge: 2019-08-21 | Disposition: A | Discharge status: Home / Self Care | Payer: Self-pay

## 2019-08-21 ENCOUNTER — Other Ambulatory Visit: Payer: Self-pay

## 2019-08-21 ENCOUNTER — Other Ambulatory Visit
Admission: RE | Admit: 2019-08-21 | Discharge: 2019-08-21 | Disposition: A | Discharge status: Home / Self Care | Payer: Self-pay | Attending: Gastroenterology | Admitting: Gastroenterology

## 2019-08-21 ENCOUNTER — Ambulatory Visit (INDEPENDENT_AMBULATORY_CARE_PROVIDER_SITE_OTHER): Payer: Self-pay | Admitting: Gastroenterology

## 2019-08-21 ENCOUNTER — Encounter: Payer: Self-pay | Admitting: Gastroenterology

## 2019-08-21 ENCOUNTER — Telehealth: Payer: Self-pay

## 2019-08-21 ENCOUNTER — Ambulatory Visit: Payer: Self-pay | Admitting: Gastroenterology

## 2019-08-21 VITALS — BP 103/62 | HR 65 | Temp 98.3°F | Ht <= 58 in | Wt 114.1 lb

## 2019-08-21 DIAGNOSIS — K769 Liver disease, unspecified: Secondary | ICD-10-CM

## 2019-08-21 DIAGNOSIS — K7460 Unspecified cirrhosis of liver: Secondary | ICD-10-CM

## 2019-08-21 LAB — MPO/PR-3 (ANCA) ANTIBODIES
ANCA Proteinase 3: <3.5 U/mL (ref 0.0–3.5)
Myeloperoxidase Abs: <9 U/mL (ref 0.0–9.0)

## 2019-08-21 LAB — VITAMIN D 25 HYDROXY (VIT D DEFICIENCY, FRACTURES): Vit D, 25-Hydroxy: 16.99 ng/mL — ABNORMAL LOW (ref 30–100)

## 2019-08-21 LAB — VIRUS CULTURE

## 2019-08-21 LAB — TSH: TSH: 0.97 u[IU]/mL (ref 0.350–4.500)

## 2019-08-21 NOTE — Telephone Encounter (Signed)
Tried to call patient but voicemail is not set up  

## 2019-08-21 NOTE — Telephone Encounter (Signed)
-----   Message from Lin Landsman, MD sent at 08/21/2019  3:54 PM EST ----- Regarding: Schedule CT She will need CT liver mass protocolDiagnosis: Liver lesion on ultrasound  Thanks RV

## 2019-08-21 NOTE — Progress Notes (Signed)
Vickie Darby, MD [address]  [place], [place] [address]  Main: [phone]  Fax: [phone]    Gastroenterology Consultation  Referring Provider:     Inc, [place] Physician:  Inc, [facility] Primary Gastroenterologist:  Dr. Cephas Smith Reason for Consultation: Cirrhosis of liver        HPI:   Vickie Smith is a 54 y.o. female referred by Dr. Alison Stalling, [facility]  for consultation & management of cirrhosis of liver.  Patient was recently admitted to [facility] secondary to hemoptysis.  If it was initially thought the patient had hematemesis, found to have thrombocytopenia, poorly controlled diabetes, ultrasound liver revealed cirrhosis of liver.  Underwent upper endoscopy which revealed portal gastropathy only and no source of bleeding identified.  Patient developed hemoptysis postprocedure, underwent emergent intubation, CT angio revealed bleeding in the right upper lobe, underwent embolization.  Patient was discharged home yesterday on 1 week course of antibiotics for pneumonia. Last hemoglobin 12.1, platelets 146, INR 1.1, PT 13.8  She has severe hoarseness of her voice and sore throat since extubation.  Patient has appointment to see Dr. Patsey Berthold, pulmonology on 09/17/2019 Since discharge, she denies any further episodes of hemoptysis, hematemesis, melena, rectal bleeding, abdominal pain, nausea or vomiting  She does not smoke or drink alcohol She reports mother with cirrhosis  Of note, patient recovered from COVID-19 infection in 02/2019  NSAIDs: None  Antiplts/Anticoagulants/Anti thrombotics: None  GI Procedures: EGD 2-21 - Normal duodenal bulb and second portion of the duodenum. - Portal hypertensive gastropathy. - Normal gastroesophageal junction and esophagus. - No specimens collected.  Past Medical History:  Diagnosis Date  . Diabetes mellitus without complication [facility])     Past Surgical  History:  Procedure Laterality Date  . EMBOLIZATION N/A 08/15/2019   Procedure: Bronchial Artery Embolization;  Surgeon: Katha Cabal, MD;  Location: [facility];  Service: Cardiovascular;  Laterality: N/A;  . ESOPHAGOGASTRODUODENOSCOPY N/A 08/12/2019   Procedure: ESOPHAGOGASTRODUODENOSCOPY (EGD);  Surgeon: Lin Landsman, MD;  Location: [facility] ENDOSCOPY;  Service: Gastroenterology;  Laterality: N/A;    Current Outpatient Medications:  .  acetaminophen (TYLENOL) 325 MG tablet, Take 2 tablets (650 mg total) by mouth every 4 (four) hours as needed for mild pain (temp > 101.5)., Disp:  , Rfl:  .  Insulin Glargine (BASAGLAR KWIKPEN) 100 UNIT/ML SOPN, Inject 0.08 mLs (8 Units total) into the skin daily. On 2/13 decrease your dose to 5 units daily, Disp: 5 pen, Rfl: 0 .  Insulin Pen Needle 32G X 4 MM MISC, Inject 1 Container into the skin daily. Use one new needle daily for insulin injection., Disp: 30 each, Rfl: 1 .  menthol-cetylpyridinium (CEPACOL) 3 MG lozenge, Take 1 lozenge (3 mg total) by mouth 2 (two) times daily., Disp: 100 tablet, Rfl: 12 .  metFORMIN (GLUCOPHAGE) 500 MG tablet, Take 1 tablet (500 mg total) by mouth 2 (two) times daily with a meal., Disp: 60 tablet, Rfl: 0 .  predniSONE (DELTASONE) 20 MG tablet, 2 tabs for 2 days, then 1 tab for 2 days, then 1/2 tab for 2 days and stop, Disp: 7 tablet, Rfl: 0  History reviewed. No pertinent family history.   Social History   Tobacco Use  . Smoking status: Never Smoker  . Smokeless tobacco: Never Used  Substance Use Topics  . Alcohol use: Never  . Drug use: Never    Allergies as of 08/21/2019  . (No  Known Allergies)    Review of Systems:    All systems reviewed and negative except where noted in HPI.   Physical Exam:  BP 103/62 (BP Location: Left Arm, Patient Position: Sitting, Cuff Size: Normal)   Pulse 65   Temp 98.3 F (36.8 C) (Oral)   Ht 4' 9"  (1.448 m)   Wt 114 lb 2 oz (51.8 kg)   BMI 24.70 kg/m  No  LMP recorded. Patient is postmenopausal.  General:   Alert,  Well-developed, well-nourished, pleasant and cooperative in NAD Head:  Normocephalic and atraumatic. Eyes:  Sclera clear, no icterus.   Conjunctiva pink. Ears:  Normal auditory acuity. Nose:  No deformity, discharge, or lesions. Mouth:  No deformity or lesions,oropharynx pink & moist. Neck:  Supple; no masses or thyromegaly. Lungs:  Respirations even and unlabored.  Clear throughout to auscultation.   No wheezes, crackles, or rhonchi. No acute distress. Heart:  Regular rate and rhythm; no murmurs, clicks, rubs, or gallops. Abdomen:  Normal bowel sounds. Soft, non-tender and non-distended without masses, hepatosplenomegaly or hernias noted.  No guarding or rebound tenderness.   Rectal: Not performed Msk:  Symmetrical without gross deformities. Good, equal movement & strength bilaterally. Pulses:  Normal pulses noted. Extremities:  No clubbing or edema.  No cyanosis. Neurologic:  Alert and oriented x3;  grossly normal neurologically. Skin:  Intact without significant lesions or rashes. No jaundice. Psych:  Alert and cooperative. Normal mood and affect.  Imaging Studies: Reviewed  Assessment and Plan:   Vickie Smith is a 54 y.o. Hispanic female with uncontrolled diabetes, hemoglobin A1c 11 is seen as a hospital follow-up to establish care for cirrhosis of liver  Cirrhosis of liver, well compensated: likely [place] in etiology Secondary liver disease work-up including ANA, antimitochondrial antibodies, ferritin, alpha-1 antitrypsin, viral hepatitis panel came back negative.  HIV negative Recommend checking anti-smooth muscle antibodies, celiac panel, ceruloplasmin levels, TSH Portal hypertension: Manifested as thrombocytopenia and portal hypertensive gastropathy.  No evidence of esophageal or gastric varices Euvolemic, recommend low-sodium diet HCC screening: Area of variable hepatic echogenicity measuring approximately 3.5 x  3.6 cm in the anterior right hepatic lobe on ultrasound on 08/12/2019.  Dopplers were negative for portal vein thrombosis.  Recommend CT liver mass protocol to further delineate the liver lesion HRS: None PSE: None  Elevated H. pylori IgG: Will discuss with her about triple therapy during next visit  Follow up in 4 to 6 weeks   Vickie Darby, MD

## 2019-08-21 NOTE — Patient Instructions (Signed)
Diabetes mellitus y nutricin, en adultos Diabetes Mellitus and Nutrition, Adult Si sufre de diabetes (diabetes mellitus), es muy importante tener hbitos alimenticios saludables debido a que sus niveles de Designer, television/film set sangre (glucosa) se ven afectados en gran medida por lo que come y bebe. [place] alimentos saludables en las cantidades [place], aproximadamente a la [employer], [place] ayudar a:  Aeronautical engineer glucemia.  Disminuir el riesgo de sufrir una enfermedad cardaca.  Mejorar la presin arterial.  Science writer o mantener un peso saludable. Todas las personas que sufren de diabetes son diferentes y cada una tiene necesidades diferentes en cuanto a un plan de alimentacin. El mdico puede recomendarle que trabaje con un especialista en dietas y nutricin (nutricionista) para Financial trader plan para usted. Su plan de alimentacin puede variar segn factores como:  Las caloras que necesita.  Los medicamentos que toma.  Su peso.  Sus niveles de glucemia, presin arterial y colesterol.  Su nivel de [place].  Otras afecciones que tenga, como enfermedades cardacas o renales. Cmo me afectan los carbohidratos? Los carbohidratos, o hidratos de carbono, afectan su nivel de glucemia ms que cualquier otro tipo de alimento. La ingesta de carbohidratos naturalmente aumenta la cantidad de [employer]. El recuento de carbohidratos es un mtodo destinado a Catering manager un registro de la cantidad de carbohidratos que se consumen. El recuento de carbohidratos es importante para Theatre manager la glucemia a un nivel saludable, especialmente si utiliza insulina o toma determinados medicamentos por va oral para la diabetes. Es importante conocer la cantidad de carbohidratos que se pueden ingerir en cada comida sin correr Engineer, manufacturing. Esto es Psychologist, forensic. Su nutricionista puede ayudarlo a calcular la cantidad de carbohidratos que debe ingerir en cada comida y en cada  refrigerio. Entre los alimentos que contienen carbohidratos, se incluyen:  Pan, cereal, arroz, pastas y galletas.  Papas y maz.  Guisantes, frijoles y lentejas.  Leche y Estate agent.  Lambert Mody y [place].  Postres, como pasteles, galletas, helado y caramelos. Cmo me afecta el alcohol? El alcohol puede provocar disminuciones sbitas de la glucemia (hipoglucemia), especialmente si utiliza insulina o toma determinados medicamentos por va oral para la diabetes. La hipoglucemia es una afeccin potencialmente mortal. Los sntomas de la hipoglucemia (somnolencia, mareos y confusin) son similares a los sntomas de haber consumido demasiado alcohol. Si el mdico afirma que el alcohol es seguro para usted, [place] estas pautas:  Limite el consumo de alcohol a no ms de 26mdida por da si es mujer y no est [place] y a 219midas si es hombre. Una medida equivale a 12oz (35564mde cerveza, 5oz (148m60me vino o 1oz (44ml75m bebidas alcohlicas de alta graduacin.  No beba con el estmago vaco.  Mantngase hidratado bebiendo agua, refrescos dietticos o t helado sin azcar.  Tenga en cuenta que los refrescos comunes, los jugos y otras bebida para mezclOptician, dispensingen contener mucha azcar y se deben contar como carbohidratos. Cules son algunos consejos para seguir este plan?  Leer las etiquetas de los alimentos  Comience por leer el tamao de la porcin en la "Informacin nutricional" en las etiquetas de los alimentos envasados y las bebidas. La cantidad de caloras, carbohidratos, grasas y otros nutrientes mencionados en la etiqueta se basan en una porcin del alimento. Muchos alimentos contienen ms de una porcin por envase.  Verifique la cantidad total de gramos (g) de carbohidratos totales en una porcin. Puede calcular la cantidad de porciones de carbohidratos al dividir el  total de carbohidratos por 15. Por ejemplo, si un alimento tiene un total de 30g de carbohidratos, equivale a 2  porciones de carbohidratos.  Verifique la cantidad de gramos (g) de grasas saturadas y grasas trans en una porcin. Escoja alimentos que no contengan grasa o que tengan un bajo contenido.  Verifique la cantidad de miligramos (mg) de sal (sodio) en una porcin. La [employer] de las personas deben limitar la ingesta de sodio total a menos de 2325m por dTraining and development officer  Siempre consulte la informacin nutricional de los alimentos etiquetados como "con bajo contenido de grasa" o "sin grasa". Estos alimentos pueden tener un mayor contenido de aLocation manageragregada o carbohidratos refinados, y deben evitarse.  Hable con su nutricionista para identificar sus objetivos diarios en cuanto a los nutrientes mencionados en la etiqueta. Al ir de compras  Evite comprar alimentos procesados, enlatados o precocinados. Estos alimentos tienden a tSpecial educational needs teachermayor cantidad de gGerlach sodio y azcar agregada.  Compre en la zona exterior de la tienda de comestibles. Esta zona incluye frutas y verduras frescas, granos a granel, carnes frescas y productos lcteos frescos. Al cocinar  Utilice mtodos de coccin a baja temperatura, como hornear, en lugar de mtodos de coccin a alta temperatura, como frer en abundante aceite.  Cocine con aceites saludables, como el aceite de oPeebles canola o gMilton  Evite cocinar con manteca, crema o carnes con alto contenido de grasa. Planificacin de las comidas  Coma las comidas y los refrigerios regularmente, preferentemente a la misma hora todos lMorrilton Evite pasar largos perodos de tiempo sin comer.  Consuma alimentos ricos en fibra, como frutas frescas, verduras, frijoles y cereales integrales. Consulte a su nutricionista sobre cuntas porciones de carbohidratos puede consumir en cada comida.  Consuma entre 4 y 6 onzas (oz) de protenas magras por da, como carnes mEllsworth pollo, pescado, huevos o tofu. Una onza de protena magra equivale a: ? 1 onza de carne, pollo o  pescado. ? 1huevo. ?  taza de tofu.  Coma algunos alimentos por da que contengan grasas saludables, como aguacates, frutos secos, semillas y pescado. Estilo de vida  Controle su nivel de glucemia con regularidad.  Haga actividad fsica habitualmente como se lo haya indicado el mdico. Esto puede incluir lo siguiente: ? 1551mutos semanales de ejercicio de intensidad moderada o alta. Esto podra incluir caminatas dinmicas, ciclismo o gimnasia acutica. ? Realizar ejercicios de elongacin y de fortalecimiento, como yoga o levantamiento de pesas, por lo menos 2veces por semana.  Tome los meTenneco Ince lo haya indicado el mdico.  No consuma ningn producto que contenga nicotina o tabaco, como cigarrillos y ciPsychologist, sport and exerciseSi necesita ayuda para dejar de fumar, consulte al mdHess Corporationon un asesor o instructor en diabetes para identificar estrategias para controlar el estrs y cualquier desafo emocional y social. Preguntas para hacerle al mdico  Es necesario que consulte a unRadio broadcast assistantn el cuidado de la diabetes?  Es necesario que me rena con un nutricionista?  A qu nmero puedo llamar si tengo preguntas?  Cules son los mejores momentos para controlar la glucemia? Dnde encontrar ms informacin:  Asociacin Estadounidense de la Diabetes (American Diabetes Association): diabetes.org  Academia de Nutricin y DiInformation systems managerAcademy of Nutrition and Dietetics): www.eatright.orAieaiabetes y las Enfermedades Digestivas y Renales (NSt John'S Episcopal Hospital South Shoref Diabetes and Digestive and Kidney Diseases, NIH): wwDesMoinesFuneral.dkesumen  Un plan de alimentacin saludable lo ayudar a coAeronautical engineerlucemia y maTheatre managern estilo de vida saludable.  Trabajar con un especialista en dietas y nutricin (nutricionista) puede ayudarlo a Insurance claims handler de alimentacin para usted.  Tenga en cuenta que los carbohidratos (hidratos de  carbono) y el alcohol tienen efectos inmediatos en sus niveles de glucemia. Es importante contar los carbohidratos que ingiere y consumir alcohol con prudencia. Esta informacin no tiene Marine scientist el consejo del mdico. Asegrese de hacerle al mdico cualquier pregunta que tenga. Document Revised: 03/06/2017 Document Reviewed: 10/16/2016 Elsevier Patient Education  [place] alimentacin con bajo contenido de sodio Low-Sodium Eating Plan El sodio, que es uno de los elementos que constituyen la sal, ayuda a Theatre manager un equilibrio saludable de los fluidos corporales. Mucha cantidad de sodio puede aumentar la presin arterial y Field seismologist que el organismo retenga lquidos y residuos. El mdico o el nutricionista podran recomendarle que siga este plan si tiene presin arterial alta (hipertensin), enfermedad renal, enfermedad heptica o insuficiencia cardaca. El consumo de una menor cantidad de sodio puede ayudar a Omnicom presin arterial, reducir la hinchazn y Tour manager, el hgado y los riones. Consejos para seguir [place] [employer] plan [place] generales  La mayora de las personas que sigan este plan deben limitar la ingesta de sodio a entre 1500y2041m(miligramos) por dTraining and development officer Lectura de las etiquetas de los alimentos   La etiqueta de informacin nutricional indica la cantidad de sodio en una porcin de alimento. Si come ms de una porcin, debe multiplicar la cantidad indicada de sodio por la cantidad de porciones.  Elija alimentos con menos de 1431mde sodio por porcin.  Evite consumir alimentos que tengan 30023me sodio o ms por porcin. De compras  Busque productos con bajo contenido de sodio, en cuyas etiquetas suele indicarse "bajo contenido de sodio" o "sin agregado de sal".  Siempre controle el contenido de sodio, incluso si en la etiqueta de los alimentos dice "sin sal" o "sin agregado de sal".  Compre alimentos frescos. ? Evite los alimentos  enlatados y comidas precocidas o congeladas. ? Evite las carnes enlatadas, curadas o procesadas.  Compre panes que tengan menos de 57m86m sodio por rebanada. Coccin  Consuma ms comida casera y menos de restaurante, de bufs y comida rpida.  Evite agregar sal cuando cocine. Use hierbas o aderezos sin sal, en lugar de sal de mesa o sal marina. Consulte al mdico o farmacutico antes de usar sustitutos de la sal.  Cocine con aceites de origen vegetal, como el de canola, girasol u olivStockton Universityanificacin de las comidas  Cuando coma en un restaurante, pida que preparen su comida con menos sal o, en lo posible, sin nada de sal.  Evite consumir alimentos que contengan GMS (glutamato monosdico). El GMS suele agregarse a la comida chinThailand consom y a algunos alimentos enlatados. Qu alimentos se recomiendan? Los alimentos enumerados a continuacin no constituyen una Furniture conservator/restorerble con el nutricionista sobre las mejores opciones alimenticias para usted. Cereales Cereales con bajo contenido de sodio, como avenGrenlochroz y trigo inflados, y trigo triturado. Galletas con bajo contenido de sodiAlamoroz sin sal. Pastas sin sal. Pan con bajo contenido de sodio. Panes y pastas integrales. Verduras Verduras frescas o congeladas. Verduras enlatadas "sin agregado de sal". Salsa de tomate y pastas "sin agregado de sal". Jugos de tomate y verduras con bajo contenido de sodio o reducidos en sodio. FrutLambert Modytas frescas, congeladas o enlatadas. Jugo de frutas. Carnes y otros alimentos proteicos Carne de vaca o ave, pescado y mariscos frescos o congelados (sin  agregado de sal). Atn y salmn enlatado con bajo contenido de sodio. Frutos secos sin sal. Lentejas, frijoles y guisantes secos, sin agregado de sal. Frijoles enlatados sin sal. Huevos. Mantequillas de frutos secos sin sal. Lcteos Leche. Leche de soja. Quesos que, por naturaleza, tengan bajo contenido de sodio, por ejemplo, la ricota, la  mozzarella fresca o el queso suizo; o quesos con bajo contenido de sodio o reducidos en sodio. Queso crema. Yogur. Grasas y aceites Mantequilla sin sal. Margarina sin sal ni grasas trans. Aceites vegetales, como de canola u oliva. Condimentos y otros alimentos Hierbas y especias frescas y secas. Aderezos sin sal. Mostaza y ktchup con bajo contenido de [place]. Aderezos para ensalada sin sodio. Mayonesa light sin sodio. Rbano picante fresco o refrigerado. Jugo de limn. [place]. [place]opas caseras o con contenido de sodio bajo o reducido. Palomitas de maz y pretzels sin sal. Papas fritas sin sal o con bajo contenido de sal. Qu alimentos no se recomiendan? Los alimentos enumerados a continuacin no constituyen Furniture conservator/restorer. Hable con el nutricionista sobre las mejores opciones alimenticias para usted. Cereales Cereales instantneos para comer caliente. Mezclas para bizcochos, panqueques y rellenos de pan. Crutones. Mezclas para pastas o arroz con condimento. Envases comerciales de sopa de fideos. Macarrones con queso envasados o congelados. Galletas saladas comunes. Harina leudante. Verduras Chucrut, verduras en escabeche y salsas. Aceitunas. Papas fritas. Anillos de cebollas. Verduras enlatadas regulares (que no sean con bajo contenido de sodio o reducidas en sodio). Pasta y salsa de tomates enlatadas regulares (que no sean con bajo contenido de sodio o reducidas en sodio). Jugos de tomate y verduras regulares (que no sean con bajo contenido de sodio o reducidos en sodio). Verduras Surveyor, minerals. Carnes y otros alimentos proteicos Carne de vaca o pescado que est [place], [place], [place], condimentada o en escabeche. [place], jamn, salchichas, hotdogs, carne en conserva, carne ahumada, embutidos envasados, carne de cerdo salada, cecina, arenques en escabeche, anchoas, atn enlatado comn, sardinas, nueces saladas. Lcteos Quesos para untar y quesos procesados. Requesn. Queso azul. Queso  feta. Queso en hebras. [place] comn. Suero de [place]. Leche enlatada. Grasas y aceites Mantequilla con sal. Margarina comn. Mantequilla clarificada. Grasa de panceta. Condimentos y otros alimentos Sal de cebolla, sal de ajo, sal condimentada, sal de mesa y sal marina. Salsas en lata y envasadas. Salsa [place]. Salsa trtara. Salsa barbacoa. Salsa teriyaki. Salsa de soja, incluso la que tiene contenido reducido de [place]. Salsa de carne. Salsa de pescado. Salsa de [place]. Salsa rosada. Rbano picante envasado. [place] y [employer]. Saborizantes y tiernizantes para carne. Caldo en cubitos. Salsa picante y salsa tabasco. Escabeches envasados o ya preparados. Aderezos para tacos prefabricados o envasados. Salsas. Aderezos comunes para ensalada. Salsa. Nachos y papas fritas envasadas. Maz inflado y frituras de maz. Palomitas de maz y pretzels con sal. Sopas enlatadas o en polvo. Pizza. Pasteles y entradas congeladas. Resumen  El consumo de una menor cantidad de sodio puede ayudar a Omnicom presin arterial, reducir la hinchazn y Tour manager, el hgado y los riones.  La [employer] Farm de las personas que sigan este plan deben limitar la ingesta de sodio a entre 1500y2085m(miligramos) por dTraining and development officer  Los aSunTrust envasados y congelados tienen alto contenido de sUnion La pizza, la comida rpida y la comida de los restaurantes tambin contienen mucho sodio. Tambin aporta sodio al agregar sal a las comidas.  Intente cocinar en su hogar, coma ms frutas y verduras frescas, y coma menos comidas rpidas y alimentos  enlatados, procesados o preparados. Esta informacin no tiene Marine scientist el consejo del mdico. Asegrese de hacerle al mdico cualquier pregunta que tenga. Document Revised: 11/02/2016 Document Reviewed: 11/02/2016 Elsevier Patient Education  [place].

## 2019-08-22 LAB — HEPATITIS B SURFACE ANTIBODY, QUANTITATIVE: Hep B S AB Quant (Post): 5.4 m[IU]/mL — ABNORMAL LOW (ref >9.9)

## 2019-08-22 LAB — HEPATITIS B CORE ANTIBODY, TOTAL: Hep B Core Total Ab: NONREACTIVE

## 2019-08-22 LAB — CERULOPLASMIN: Ceruloplasmin: 29.2 mg/dL (ref 19.0–39.0)

## 2019-08-23 LAB — CELIAC DISEASE PANEL
Endomysial Ab, IgA: NEGATIVE
IgA: 557 mg/dL — ABNORMAL HIGH (ref 87–352)
Tissue Transglutaminase Ab, IgA: <2 U/mL (ref 0–3)

## 2019-08-24 LAB — ANTI-SMOOTH MUSCLE ANTIBODY, IGG: F-Actin IgG: 5 Units (ref 0–19)

## 2019-08-25 LAB — ANCA TITERS
Atypical P-ANCA titer: <1:20 {titer}
C-ANCA: <1:20 {titer}
P-ANCA: <1:20 {titer}

## 2019-08-25 LAB — ENA+DNA/DS+ANTICH+CENTRO+JO...
Anti JO-1: <0.2 AI (ref 0.0–0.9)
Centromere Ab Screen: <0.2 AI (ref 0.0–0.9)
Chromatin Ab SerPl-aCnc: <0.2 AI (ref 0.0–0.9)
ENA SM Ab Ser-aCnc: <0.2 AI (ref 0.0–0.9)
Ribonucleic Protein: <0.2 AI (ref 0.0–0.9)
SSA (Ro) (ENA) Antibody, IgG: <0.2 AI (ref 0.0–0.9)
SSB (La) (ENA) Antibody, IgG: 4.7 AI — ABNORMAL HIGH (ref 0.0–0.9)
Scleroderma (Scl-70) (ENA) Antibody, IgG: <0.2 AI (ref 0.0–0.9)
ds DNA Ab: 1 IU/mL (ref 0–9)

## 2019-08-25 LAB — ANA W/REFLEX IF POSITIVE: Anti Nuclear Antibody (ANA): POSITIVE — AB

## 2019-08-25 LAB — MPO/PR-3 (ANCA) ANTIBODIES
ANCA Proteinase 3: <3.5 U/mL (ref 0.0–3.5)
Myeloperoxidase Abs: <9 U/mL (ref 0.0–9.0)

## 2019-09-03 ENCOUNTER — Telehealth: Payer: Self-pay | Admitting: Gastroenterology

## 2019-09-03 NOTE — Telephone Encounter (Signed)
Patient called with interpreter on the line & has requested a return to work letter.

## 2019-09-03 NOTE — Telephone Encounter (Signed)
Patient called back and order the CT scan for patient. Patient states she can do any Thursday or Friday. 09/12/2019 at out patient imagining 3:30. Arrived at 3:15 for appointment. Please pick up contrast before appointment. Nothing to eat or drank 4 hours before procedure.  Called patient and gave patient the address for the out patient imaging and the instructions for the CT scan. And informed patient nothing to eat drink 4 hours before scan.

## 2019-09-03 NOTE — Telephone Encounter (Signed)
Use the interpreter services at [phone] the ID is [id number] Patient is calling because was in the hospital on 08/12/2019-08/20/2019. At discharged she received a return to work not. The work note stated that she could returned to work on 08/25/2019. Patient states after she was discharge from the hospital she began to have abdominal pain and diarrhea. Patient states she also loss her voice and was unable to really talk. Patient states she has not went to work since her hospital stay. Patient states now she is feeling better and is needing a new note saying she can return to work. Explain to patient that this was not discuss at her office visit and we did not write her out of work. Informed patient I would ask Dr. Marius Ditch if she could do this and would give the patient a call back

## 2019-09-03 NOTE — Addendum Note (Signed)
Addended by: Ulyess Blossom L on: 09/03/2019 02:17 PM   Modules accepted: Orders

## 2019-09-04 ENCOUNTER — Encounter: Payer: Self-pay | Admitting: Gastroenterology

## 2019-09-04 NOTE — Telephone Encounter (Signed)
Informed patient that this letter was ready for pick up

## 2019-09-04 NOTE — Telephone Encounter (Signed)
I wrote a letter that she can return to work  RV

## 2019-09-12 ENCOUNTER — Other Ambulatory Visit: Payer: Self-pay

## 2019-09-12 ENCOUNTER — Ambulatory Visit
Admission: RE | Admit: 2019-09-12 | Discharge: 2019-09-12 | Disposition: A | Discharge status: Home / Self Care | Payer: Self-pay | Source: Ambulatory Visit | Attending: Gastroenterology | Admitting: Gastroenterology

## 2019-09-12 DIAGNOSIS — K769 Liver disease, unspecified: Secondary | ICD-10-CM

## 2019-09-12 MED ORDER — IOHEXOL 300 MG/ML  SOLN
100.0000 mL | Freq: Once | INTRAMUSCULAR | Status: AC | PRN
  Administered 2019-09-12: 100 mL via INTRAVENOUS

## 2019-09-15 ENCOUNTER — Telehealth: Payer: Self-pay

## 2019-09-15 NOTE — Telephone Encounter (Signed)
-----   Message from Lin Landsman, MD sent at 09/15/2019  4:20 PM EST ----- Please inform patient that the CT abdomen of her liver did not reveal any liver lesions other than cirrhosis.  This is good news.  We will repeat ultrasound in 1 year  Rohini Vanga

## 2019-09-15 NOTE — Telephone Encounter (Signed)
Used interpreter and patient verbalized understanding

## 2019-09-17 ENCOUNTER — Inpatient Hospital Stay: Payer: Self-pay | Admitting: Pulmonary Disease

## 2019-09-25 LAB — ACID FAST CULTURE WITH REFLEXED SENSITIVITIES (MYCOBACTERIA): Acid Fast Culture: NEGATIVE

## 2019-09-29 LAB — ACID FAST CULTURE WITH REFLEXED SENSITIVITIES (MYCOBACTERIA): Acid Fast Culture: NEGATIVE

## 2019-10-01 ENCOUNTER — Ambulatory Visit (INDEPENDENT_AMBULATORY_CARE_PROVIDER_SITE_OTHER): Payer: Self-pay | Admitting: Gastroenterology

## 2019-10-01 ENCOUNTER — Encounter: Payer: Self-pay | Admitting: Gastroenterology

## 2019-10-01 ENCOUNTER — Other Ambulatory Visit: Payer: Self-pay

## 2019-10-01 VITALS — BP 115/70 | HR 94 | Temp 97.0°F | Wt 120.0 lb

## 2019-10-01 DIAGNOSIS — Z1211 Encounter for screening for malignant neoplasm of colon: Secondary | ICD-10-CM

## 2019-10-01 DIAGNOSIS — R1013 Epigastric pain: Secondary | ICD-10-CM

## 2019-10-01 DIAGNOSIS — G8929 Other chronic pain: Secondary | ICD-10-CM

## 2019-10-01 DIAGNOSIS — K7460 Unspecified cirrhosis of liver: Secondary | ICD-10-CM

## 2019-10-01 DIAGNOSIS — K7581 Nonalcoholic steatohepatitis (NASH): Secondary | ICD-10-CM | POA: Insufficient documentation

## 2019-10-01 DIAGNOSIS — K5904 Chronic idiopathic constipation: Secondary | ICD-10-CM

## 2019-10-01 DIAGNOSIS — R768 Other specified abnormal immunological findings in serum: Secondary | ICD-10-CM

## 2019-10-01 LAB — ACID FAST CULTURE WITH REFLEXED SENSITIVITIES (MYCOBACTERIA): Acid Fast Culture: NEGATIVE

## 2019-10-01 MED ORDER — CLARITHROMYCIN 500 MG PO TABS: 500.0000 mg | ORAL_TABLET | Freq: Two times a day (BID) | ORAL | 0 refills | Status: AC

## 2019-10-01 MED ORDER — ONDANSETRON HCL 4 MG PO TABS: 4.0000 mg | ORAL_TABLET | Freq: Three times a day (TID) | ORAL | 1 refills | Status: AC | PRN

## 2019-10-01 MED ORDER — NA SULFATE-K SULFATE-MG SULF 17.5-3.13-1.6 GM/177ML PO SOLN: 354.0000 mL | Freq: Once | ORAL | 0 refills | Status: AC

## 2019-10-01 MED ORDER — AMOXICILLIN 500 MG PO TABS: 1000.0000 mg | ORAL_TABLET | Freq: Two times a day (BID) | ORAL | 0 refills | Status: AC

## 2019-10-01 MED ORDER — OMEPRAZOLE 40 MG PO CPDR: 40.0000 mg | DELAYED_RELEASE_CAPSULE | Freq: Two times a day (BID) | ORAL | 0 refills | Status: AC

## 2019-10-01 NOTE — Patient Instructions (Addendum)
Take medication with Food and take Zofran as needed for nausea Dieta rica en fibra High-Fiber Diet La fibra, tambin llamada fibra dietaria, es un tipo de carbohidrato que se encuentra en las frutas, las verduras, los cereales integrales y los frijoles. Una dieta rica en fibra puede tener muchos beneficios para la salud. El mdico puede recomendar una dieta rica en fibra para ayudar a:  Contractor. La fibra puede hacer que defeque con ms frecuencia.  Disminuir el nivel de colesterol.  Aliviar las siguientes afecciones: ? Hinchazn de la venas en el ano (hemorroides). ? Hinchazn e irritacin (inflamacin) de zonas especficas del tracto digestivo (diverticulitis sin complicaciones). ? Un problema del intestino grueso (colon) que, a veces, causa dolor y diarrea (sndrome de colon irritable, SCI).  Evitar comer en exceso como parte de un plan para bajar de peso.  Evitar la enfermedad cardaca, la diabetes tipo 2 y ciertos cnceres. En qu consiste el plan? El consumo diario recomendado de fibra en gramos (g) incluye lo siguiente:  38g para hombres de 36 aos o menos.  30g para los hombres mayores de 50 aos.  25g para mujeres de 50 aos o menos.  21g para las [employer] de 50 aos. Puede recibir la ingesta diaria recomendada de fibra dietaria de la siguiente manera:  Coma una variedad de frutas, verduras, granos y frijoles.  Tome un suplemento de [place], si no es posible comer suficiente fibra en su dieta. Qu debo saber acerca de la dieta rica en fibra?  Es mejor obtener fibra directamente de los alimentos en lugar de recibirla de suplementos de [place]. No hay demasiada investigacin acerca de qu tan efectivos son los suplementos.  Verifique siempre el contenido de fibra en la etiqueta de informacin nutricional de los alimentos preenvasados. Busque alimentos que contengan 5g o ms de fibra por porcin.  Hable con un especialista en alimentacin y  nutricin (nutricionista) si tiene preguntas sobre alimentos especficos que se recomiendan o no para su afeccin mdica, especialmente si aquellos alimentos no estn detallados a continuacin.  Aumente gradualmente la cantidad de fibra que consume. Si aumenta demasiado rpido el consumo de fibra dietaria puede provocar distensin abdominal, clicos o gases.  Beba abundante agua. El [place] a Economist. Cules son algunos consejos para seguir este plan?  Consuma una gran variedad de alimentos ricos en fibra.  Asegrese de que al [employer] mitad de los granos que ingiere cada da sean cereales integrales.  Coma panes y cereales hechos con harina de cereales integrales en lugar de harina refinada o blanca.  Coma arroz integral, trigo burgol o mijo en lugar de Neurosurgeon con un desayuno rico en fibra, como un cereal que contenga al menos 5g de fibra o ms por porcin.  Use frijoles en lugar de carne en las sopas, ensaladas o platos de pastas.  Coma colaciones ricas en fibra, como frutos rojos, verduras crudas, frutos secos y palomitas de maz.  Elija frutas y verduras Insurance claims handler de formas procesadas como jugo o salsa. Qu alimentos puedo comer?  Frutas Frutos rojos. Peras. Manzanas. Naranjas. Aguac[place]. Ciruelas y pasas. Higos secos. Verduras Batatas. Espinaca. Col rizada. Alcachofas. Repollo. Brcoli. Coliflor. Guisantes. Zanahorias. [place]. Cereales Panes integrales. Cereal multigrano. Avena. Arroz integral. Dwyane Luo. Trigo burgol. Mijo. Quinua. Magdalenas de salvado. Palomitas de maz. Galletas de centeno. Carnes y otras protenas Frijoles blancos, colorados y pintos. Soja. Guisantes secos. Lentejas. Frutos secos y semillas. Lcteos Yogur fortificado con Pharmacist, hospital. Bebidas  Leche de soja fortificada con Fredderick [place]. Jugo de naranja fortificado con Fredderick Phenix. Otros alimentos Barras de [place]. Es posible que los productos mencionados arriba no formen una lista  completa de las bebidas y los alimentos recomendados. Comunquese con un nutricionista para conocer ms opciones. Qu alimentos no se recomiendan? Frutas Jugo de frutas. Frutas cocidas coladas. Verduras Papas fritas. Verduras enlatadas. Verduras muy cocidas. Cereales Pan blanco. Pastas hechas con Letitia Neri. Arroz Kerby Moors y otras protenas Cortes de carne con grasa. Pollo o pescado fritos. Lcteos Leche. Yogur. Queso crema. [employer]. Grasas y [employer]. Bebidas Gaseosas. Otros alimentos Tortas y pasteles. Es posible que los productos que se enumeran ms arriba no sean una lista completa de los alimentos y las bebidas que se Higher education careers adviser. Comunquese con un nutricionista para obtener ms informacin. Resumen  La fibra es un tipo de carbohidrato. Se encuentra en las frutas, las verduras, los cereales integrales y los frijoles.  Una dieta rica en fibra representa muchos beneficios para la salud, como evitar el estreimiento, Management consultant colesterol en la [place], ayudar a perder peso y reducir el riesgo de enfermedad cardaca, diabetes y ciertos tipos de cncer.  Aumente gradualmente la ingesta de fibra. El aumento demasiado rpido puede provocar clicos, distensin abdominal y gases. Beba mucha agua a la vez que [employer].  Las mejores fuentes de fibra incluyen frutas y verduras enteras, granos integrales, frutos secos, semillas y frijoles. Esta informacin no tiene Marine scientist el consejo del mdico. Asegrese de hacerle al mdico cualquier pregunta que tenga. Document Revised: 10/06/2017 Document Reviewed: 06/15/2017 Elsevier Patient Education  [place].

## 2019-10-01 NOTE — Progress Notes (Signed)
Cephas Darby, MD [address]  [place]  [place], [place] [address]  Main: [phone]  Fax: [phone]    Gastroenterology Consultation  Referring Provider:     Inc, [place] Physician:  Inc, [place] Primary Gastroenterologist:  Dr. Cephas Darby Reason for Consultation: Cirrhosis of liver, constipation, epigastric pain        HPI:   Vickie Smith is a 54 y.o. female referred by Dr. Alison Stalling, [facility]  for consultation & management of cirrhosis of liver.  Patient was recently admitted to [facility] secondary to hemoptysis.  If it was initially thought the patient had hematemesis, found to have thrombocytopenia, poorly controlled diabetes, ultrasound liver revealed cirrhosis of liver.  Underwent upper endoscopy which revealed portal gastropathy only and no source of bleeding identified.  Patient developed hemoptysis postprocedure, underwent emergent intubation, CT angio revealed bleeding in the right upper lobe, underwent embolization.  Patient was discharged home yesterday on 1 week course of antibiotics for pneumonia. Last hemoglobin 12.1, platelets 146, INR 1.1, PT 13.8  She has severe hoarseness of her voice and sore throat since extubation.  Patient has appointment to see Dr. Patsey Berthold, pulmonology on 09/17/2019 Since discharge, she denies any further episodes of hemoptysis, hematemesis, melena, rectal bleeding, abdominal pain, nausea or vomiting  She does not smoke or drink alcohol She reports mother with cirrhosis  Of note, patient recovered from COVID-19 infection in 02/2019  Follow-up visit 10/01/2019 Patient reports doing well since last visit.  Her only concern today is constipation, irregular bowel habits about every 3 days, hard stools.  She denies any rectal bleeding.  She also reports epigastric pain, denies nausea or vomiting. She reports that her blood sugars range anywhere from 150s to low 200s.  She is  currently on insulin.  She denies any black stools, abdominal distention, swelling of legs.   NSAIDs: None  Antiplts/Anticoagulants/Anti thrombotics: None  GI Procedures: EGD 2-21 - Normal duodenal bulb and second portion of the duodenum. - Portal hypertensive gastropathy. - Normal gastroesophageal junction and esophagus. - No specimens collected.  Past Medical History:  Diagnosis Date  . Diabetes mellitus without complication [facility])     Past Surgical History:  Procedure Laterality Date  . EMBOLIZATION N/A 08/15/2019   Procedure: Bronchial Artery Embolization;  Surgeon: Katha Cabal, MD;  Location: [facility];  Service: Cardiovascular;  Laterality: N/A;  . ESOPHAGOGASTRODUODENOSCOPY N/A 08/12/2019   Procedure: ESOPHAGOGASTRODUODENOSCOPY (EGD);  Surgeon: Lin Landsman, MD;  Location: [facility] ENDOSCOPY;  Service: Gastroenterology;  Laterality: N/A;    Current Outpatient Medications:  .  acetaminophen (TYLENOL) 325 MG tablet, Take 2 tablets (650 mg total) by mouth every 4 (four) hours as needed for mild pain (temp > 101.5)., Disp:  , Rfl:  .  Insulin Glargine (BASAGLAR KWIKPEN) 100 UNIT/ML SOPN, Inject 0.08 mLs (8 Units total) into the skin daily. On 2/13 decrease your dose to 5 units daily, Disp: 5 pen, Rfl: 0 .  Insulin Pen Needle 32G X 4 MM MISC, Inject 1 Container into the skin daily. Use one new needle daily for insulin injection., Disp: 30 each, Rfl: 1 .  menthol-cetylpyridinium (CEPACOL) 3 MG lozenge, Take 1 lozenge (3 mg total) by mouth 2 (two) times daily., Disp: 100 tablet, Rfl: 12 .  metFORMIN (GLUCOPHAGE) 500 MG tablet, Take 1 tablet (500 mg total) by mouth 2 (two) times daily with a meal., Disp: 60 tablet, Rfl: 0 .  predniSONE (DELTASONE) 20  MG tablet, 2 tabs for 2 days, then 1 tab for 2 days, then 1/2 tab for 2 days and stop, Disp: 7 tablet, Rfl: 0 .  amoxicillin (AMOXIL) 500 MG tablet, Take 2 tablets (1,000 mg total) by mouth 2 (two) times daily for 14 days.,  Disp: 56 tablet, Rfl: 0 .  clarithromycin (BIAXIN) 500 MG tablet, Take 1 tablet (500 mg total) by mouth 2 (two) times daily for 14 days., Disp: 28 tablet, Rfl: 0 .  Na Sulfate-K Sulfate-Mg Sulf 17.5-3.13-1.6 GM/177ML SOLN, Take 354 mLs by mouth once for 1 dose., Disp: 354 mL, Rfl: 0 .  omeprazole (PRILOSEC) 40 MG capsule, Take 1 capsule (40 mg total) by mouth in the morning and at bedtime., Disp: 28 capsule, Rfl: 0 .  ondansetron (ZOFRAN) 4 MG tablet, Take 1 tablet (4 mg total) by mouth every 8 (eight) hours as needed for nausea or vomiting., Disp: 30 tablet, Rfl: 1  History reviewed. No pertinent family history.   Social History   Tobacco Use  . Smoking status: Never Smoker  . Smokeless tobacco: Never Used  Substance Use Topics  . Alcohol use: Never  . Drug use: Never    Allergies as of 10/01/2019  . (No Known Allergies)    Review of Systems:    All systems reviewed and negative except where noted in HPI.   Physical Exam:  BP 115/70 (BP Location: Left Arm, Patient Position: Sitting, Cuff Size: Normal)   Pulse 94   Temp (!) 97 F (36.1 C) (Oral)   Wt 120 lb (54.4 kg)   BMI 25.97 kg/m  No LMP recorded. Patient is postmenopausal.  General:   Alert,  Well-developed, well-nourished, pleasant and cooperative in NAD Head:  Normocephalic and atraumatic. Eyes:  Sclera clear, no icterus.   Conjunctiva pink. Ears:  Normal auditory acuity. Nose:  No deformity, discharge, or lesions. Mouth:  No deformity or lesions,oropharynx pink & moist. Neck:  Supple; no masses or thyromegaly. Lungs:  Respirations even and unlabored.  Clear throughout to auscultation.   No wheezes, crackles, or rhonchi. No acute distress. Heart:  Regular rate and rhythm; no murmurs, clicks, rubs, or gallops. Abdomen:  Normal bowel sounds. Soft, non-tender and non-distended without masses, hepatosplenomegaly or hernias noted.  No guarding or rebound tenderness.   Rectal: Not performed Msk:  Symmetrical without  gross deformities. Good, equal movement & strength bilaterally. Pulses:  Normal pulses noted. Extremities:  No clubbing or edema.  No cyanosis. Neurologic:  Alert and oriented x3;  grossly normal neurologically. Skin:  Intact without significant lesions or rashes. No jaundice. Psych:  Alert and cooperative. Normal mood and affect.  Imaging Studies: Reviewed  Assessment and Plan:   Thania Woodlief is a 54 y.o. Hispanic female with uncontrolled diabetes, hemoglobin A1c 11 is seen as a hospital follow-up to establish care for cirrhosis of liver  Cirrhosis of liver, well compensated: likely [place] in etiology Secondary liver disease work-up including ANA, antimitochondrial antibodies, ferritin, alpha-1 antitrypsin, viral hepatitis panel came back negative.  HIV negative, anti-smooth muscle antibodies, celiac panel, ceruloplasmin levels, TSH unremarkable Portal hypertension: Manifested as thrombocytopenia and portal hypertensive gastropathy.  No evidence of esophageal or gastric varices Euvolemic, recommend low-sodium diet HCC screening: Area of variable hepatic echogenicity measuring approximately 3.5 x 3.6 cm in the anterior right hepatic lobe on ultrasound on 08/12/2019.  Dopplers were negative for portal vein thrombosis.  CT liver mass protocol did not reveal any liver lesion, recommend right upper current ultrasound in 09/2020 HRS:  None PSE: None Control of diabetes Patient reports that she received hepatitis B vaccine as well as pneumonia vaccine at her PCPs office  Colon cancer screening Recommend colonoscopy and patient is agreeable  Chronic constipation Recommend high-fiber diet, information provided Suggested her to try MiraLAX daily  Elevated H. pylori IgG: Recommend triple therapy due to ongoing epigastric pain  Follow up in 2 months   Cephas Darby, MD

## 2019-10-05 ENCOUNTER — Ambulatory Visit: Payer: MEDICAID | Attending: Internal Medicine

## 2019-10-05 DIAGNOSIS — Z23 Encounter for immunization: Secondary | ICD-10-CM

## 2019-10-05 NOTE — Progress Notes (Signed)
   Covid-19 Vaccination Clinic  Name:  Vickie Smith    MRN: [id number] DOB: 1965/12/25  10/05/2019  Ms. Vickie Smith was observed post Covid-19 immunization for 15 minutes without incident. She was provided with Vaccine Information Sheet and instruction to access the V-Safe system.   Ms. Vickie Smith was instructed to call 911 with any severe reactions post vaccine: Marland Kitchen Difficulty breathing  . Swelling of face and throat  . A fast heartbeat  . A bad rash all over body  . Dizziness and weakness   Immunizations Administered    Name Date Dose VIS Date Route   Pfizer COVID-19 Vaccine 10/05/2019 10:31 AM 0.3 mL 06/20/2019 Intramuscular   Manufacturer: [place]   Lot: [id number]   [place]: [id number]

## 2019-10-23 ENCOUNTER — Other Ambulatory Visit: Admission: RE | Admit: 2019-10-23 | Payer: MEDICAID | Source: Ambulatory Visit

## 2019-10-26 ENCOUNTER — Ambulatory Visit: Payer: Self-pay

## 2019-10-27 ENCOUNTER — Telehealth: Payer: Self-pay

## 2019-10-27 ENCOUNTER — Encounter: Payer: Self-pay | Admitting: Gastroenterology

## 2019-10-27 ENCOUNTER — Ambulatory Visit: Payer: Self-pay | Admitting: Certified Registered"

## 2019-10-27 ENCOUNTER — Ambulatory Visit: Admission: RE | Admit: 2019-10-27 | Payer: Self-pay | Source: Home / Self Care | Admitting: Gastroenterology

## 2019-10-27 ENCOUNTER — Encounter: Admission: RE | Payer: Self-pay | Source: Home / Self Care

## 2019-10-27 ENCOUNTER — Ambulatory Visit
Admission: RE | Admit: 2019-10-27 | Discharge: 2019-10-27 | Disposition: A | Discharge status: Home / Self Care | Payer: Self-pay | Source: Ambulatory Visit | Attending: Gastroenterology | Admitting: Gastroenterology

## 2019-10-27 ENCOUNTER — Encounter
Admission: RE | Disposition: A | Discharge status: Home / Self Care | Payer: Self-pay | Source: Ambulatory Visit | Attending: Gastroenterology

## 2019-10-27 DIAGNOSIS — K635 Polyp of colon: Secondary | ICD-10-CM

## 2019-10-27 DIAGNOSIS — Z79899 Other long term (current) drug therapy: Secondary | ICD-10-CM | POA: Insufficient documentation

## 2019-10-27 DIAGNOSIS — Z794 Long term (current) use of insulin: Secondary | ICD-10-CM | POA: Insufficient documentation

## 2019-10-27 DIAGNOSIS — E119 Type 2 diabetes mellitus without complications: Secondary | ICD-10-CM | POA: Insufficient documentation

## 2019-10-27 DIAGNOSIS — K640 First degree hemorrhoids: Secondary | ICD-10-CM | POA: Insufficient documentation

## 2019-10-27 DIAGNOSIS — Z1211 Encounter for screening for malignant neoplasm of colon: Secondary | ICD-10-CM

## 2019-10-27 HISTORY — DX: COVID-19: U07.1

## 2019-10-27 HISTORY — PX: COLONOSCOPY WITH PROPOFOL: SHX5780

## 2019-10-27 LAB — POCT PREGNANCY, URINE: Preg Test, Ur: NEGATIVE

## 2019-10-27 LAB — GLUCOSE, CAPILLARY: Glucose-Capillary: 131 mg/dL — ABNORMAL HIGH (ref 70–99)

## 2019-10-27 SURGERY — COLONOSCOPY WITH PROPOFOL
Anesthesia: General

## 2019-10-27 MED ORDER — SODIUM CHLORIDE 0.9 % IV SOLN: INTRAVENOUS | Status: DC

## 2019-10-27 MED ORDER — PROPOFOL 10 MG/ML IV BOLUS
INTRAVENOUS | Status: DC | PRN
  Administered 2019-10-27: 80 mg via INTRAVENOUS
  Administered 2019-10-27 (×2): 30 mg via INTRAVENOUS

## 2019-10-27 NOTE — Transfer of Care (Signed)
Immediate Anesthesia Transfer of Care Note  Patient: Vickie Smith  Procedure(s) Performed: COLONOSCOPY WITH PROPOFOL (N/A )  Patient Location: PACU and Endoscopy Unit  Anesthesia Type:General  Level of Consciousness: sedated  Airway & Oxygen Therapy: Patient Spontanous Breathing  Post-op Assessment: Report given to RN and Post -op Vital signs reviewed and stable  Post vital signs: Reviewed and stable  Last Vitals:  Vitals Value Taken Time  BP    Temp    Pulse    Resp    SpO2      Last Pain:  Vitals:   10/27/19 1351  TempSrc: Tympanic  PainSc: 0-No pain         Complications: No apparent anesthesia complications

## 2019-10-27 NOTE — H&P (Signed)
Vickie Lame, MD [address] [place], [place] [address] Phone: [phone] Fax : [phone]  Primary Care Physician:  Inc, [facility] Primary Gastroenterologist:  Dr. Allen Norris  Pre-Procedure History & Physical: HPI:  Vickie Smith is a 54 y.o. female is here for a screening colonoscopy.   Past Medical History:  Diagnosis Date  . Aspiration pneumonia of left upper lobe due to gastric secretions ([place])   . COVID-19   . Diabetes mellitus without complication ([place])   . Hematemesis 08/12/2019  . Hemoptysis 08/12/2019  . Hypotension   . Sore throat   . Upper GI bleeding 08/12/2019    Past Surgical History:  Procedure Laterality Date  . EMBOLIZATION N/A 08/15/2019   Procedure: Bronchial Artery Embolization;  Surgeon: Katha Cabal, MD;  Location: [facility];  Service: Cardiovascular;  Laterality: N/A;  . ESOPHAGOGASTRODUODENOSCOPY N/A 08/12/2019   Procedure: ESOPHAGOGASTRODUODENOSCOPY (EGD);  Surgeon: Lin Landsman, MD;  Location: [facility] ENDOSCOPY;  Service: Gastroenterology;  Laterality: N/A;    Prior to Admission medications   Medication Sig Start Date End Date Taking? Authorizing Provider  empagliflozin (JARDIANCE) 25 MG TABS tablet Take 25 mg by mouth daily.   Yes [provider]  pravastatin (PRAVACHOL) 20 MG tablet Take 20 mg by mouth daily.   Yes [provider]  acetaminophen (TYLENOL) 325 MG tablet Take 2 tablets (650 mg total) by mouth every 4 (four) hours as needed for mild pain (temp > 101.5). 08/17/19   Bradly Bienenstock, NP  Insulin Glargine (BASAGLAR KWIKPEN) 100 UNIT/ML SOPN Inject 0.08 mLs (8 Units total) into the skin daily. On 2/13 decrease your dose to 5 units daily 08/20/19   Noe Gens L, NP  Insulin Pen Needle 32G X 4 MM MISC Inject 1 Container into the skin daily. Use one new needle daily for insulin injection. 08/20/19   Donita Brooks, NP  menthol-cetylpyridinium (CEPACOL) 3 MG lozenge Take 1 lozenge  (3 mg total) by mouth 2 (two) times daily. 08/20/19   Donita Brooks, NP  metFORMIN (GLUCOPHAGE) 500 MG tablet Take 1 tablet (500 mg total) by mouth 2 (two) times daily with a meal. Patient not taking: Reported on 10/27/2019 08/20/19 08/19/20  Donita Brooks, NP  omeprazole (PRILOSEC) 40 MG capsule Take 1 capsule (40 mg total) by mouth in the morning and at bedtime. Patient not taking: Reported on 10/27/2019 10/01/19   Lin Landsman, MD  ondansetron (ZOFRAN) 4 MG tablet Take 1 tablet (4 mg total) by mouth every 8 (eight) hours as needed for nausea or vomiting. Patient not taking: Reported on 10/27/2019 10/01/19   Lin Landsman, MD  predniSONE (DELTASONE) 20 MG tablet 2 tabs for 2 days, then 1 tab for 2 days, then 1/2 tab for 2 days and stop Patient not taking: Reported on 10/01/2019 08/20/19   Donita Brooks, NP    Allergies as of 10/27/2019  . (No Known Allergies)    History reviewed. No pertinent family history.  Social History   Socioeconomic History  . Marital status: Married    Spouse name: Not on file  . Number of children: Not on file  . Years of education: Not on file  . Highest education level: Not on file  Occupational History  . Not on file  Tobacco Use  . Smoking status: Never Smoker  . Smokeless tobacco: Never Used  Substance and Sexual Activity  . Alcohol use: Never  . Drug use: Never  .  Sexual activity: Not on file  Other Topics Concern  . 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 Minimal english   Social Determinants of Health   Financial Resource Strain:   . Difficulty of Paying Living Expenses:   Food Insecurity:   . Worried About Charity fundraiser in the Last Year:   . Arboriculturist in the Last Year:   Transportation Needs:   . Film/video editor (Medical):   Marland Kitchen Lack of Transportation (Non-Medical):   Physical Activity:   . Days of Exercise per Week:   . Minutes of  Exercise per Session:   Stress:   . Feeling of Stress :   Social Connections:   . Frequency of Communication with Friends and Family:   . Frequency of Social Gatherings with Friends and Family:   . Attends Religious Services:   . Active Member of Clubs or Organizations:   . Attends Archivist Meetings:   Marland Kitchen Marital Status:   Intimate Partner Violence:   . Fear of Current or Ex-Partner:   . Emotionally Abused:   Marland Kitchen Physically Abused:   . Sexually Abused:     Review of Systems: See HPI, otherwise negative ROS  Physical Exam: BP 104/82   Pulse 71   Temp (!) 97.4 F (36.3 C) (Tympanic)   Resp 17   Ht 4' 9"  (1.448 m)   Wt 54 kg   SpO2 100%   BMI 25.75 kg/m  General:   Alert,  pleasant and cooperative in NAD Head:  Normocephalic and atraumatic. Neck:  Supple; no masses or thyromegaly. Lungs:  Clear throughout to auscultation.    Heart:  Regular rate and rhythm. Abdomen:  Soft, nontender and nondistended. Normal bowel sounds, without guarding, and without rebound.   Neurologic:  Alert and  oriented x4;  grossly normal neurologically.  Impression/Plan: Vickie Smith is now here to undergo a screening colonoscopy.  Risks, benefits, and alternatives regarding colonoscopy have been reviewed with the patient.  Questions have been answered.  All parties agreeable.

## 2019-10-27 NOTE — Telephone Encounter (Signed)
Trish in ENDO called and states patient called the ENDO department and states that she got the COVID test done at the [facility] she was using a interpreter when she called. They hung up when she tried to transfer the call.   Called patient using the interpreter. Patient states she had covid test done Thursday last week at [facility] but called this morning and they did not have the results back. Informed patient that we would need to rescheduled the procedure to another day since the COVID testing was done at another facility and because we do not have the results of the testing. Patient got upset and said she has been prepping since yesterday and is having diarrhea and wanted it done today. Explained to patient that they will not do the procedure with out the COVID test proving she is negative. I explained to patient that is [facility] so they are guarantee back by the procedure. Asked patient if we could rescheduled the procedure but patient declined and states she will not have the procedure done now.   Sending to Dr. Marius Ditch for [facility]

## 2019-10-27 NOTE — Telephone Encounter (Signed)
Patient did not go for COVID test and can not have procedure that is scheduled today.  Called the interpreter for them to call patient.  The interpreter ID was [id number]. They tried to call patient twice with no answer and voicemail is not set up

## 2019-10-27 NOTE — Op Note (Signed)
[facility] Gastroenterology Patient Name: Vickie Smith Procedure Date: 10/27/2019 2:00 PM MRN: [id number] Account #: [id number] Date of Birth: 21-Jun-1966 Admit Type: Outpatient Age: 54 Room: [facility] ENDO ROOM 1 Gender: Female Note Status: Finalized Procedure:             Colonoscopy Indications:           Screening for colorectal malignant neoplasm Providers:             Lucilla Lame MD, MD Referring MD:          [facility] Medicines:             Propofol per Anesthesia Complications:         No immediate complications. Procedure:             Pre-Anesthesia Assessment:                        - Prior to the procedure, a History and Physical was                         performed, and patient medications and allergies were                         reviewed. The patient's tolerance of previous                         anesthesia was also reviewed. The risks and benefits                         of the procedure and the sedation options and risks                         were discussed with the patient. All questions were                         answered, and informed consent was obtained. Prior                         Anticoagulants: The patient has taken no previous                         anticoagulant or antiplatelet agents. ASA Grade                         Assessment: II - A patient with mild systemic disease.                         After reviewing the risks and benefits, the patient                         was deemed in satisfactory condition to undergo the                         procedure.                        After obtaining informed consent, the colonoscope was  passed under direct vision. Throughout the procedure,                         the patient's blood pressure, pulse, and oxygen                         saturations were monitored continuously. The                         Colonoscope was introduced through the  anus and                         advanced to the the cecum, identified by appendiceal                         orifice and ileocecal valve. The colonoscopy was                         performed without difficulty. The patient tolerated                         the procedure well. The quality of the bowel                         preparation was excellent. Findings:      The perianal and digital rectal examinations were normal.      A 5 mm polyp was found in the ascending colon. The polyp was sessile.       The polyp was removed with a cold snare. Resection and retrieval were       complete.      Non-bleeding internal hemorrhoids were found during retroflexion. The       hemorrhoids were Grade I (internal hemorrhoids that do not prolapse). Impression:            - One 5 mm polyp in the ascending colon, removed with                         a cold snare. Resected and retrieved.                        - Non-bleeding internal hemorrhoids. Recommendation:        - Discharge patient to home.                        - Resume previous diet.                        - Continue present medications.                        - Await pathology results.                        - Repeat colonoscopy in 5 years if polyp adenoma and                         10 years if hyperplastic Procedure Code(s):     --- Professional ---  45385, Colonoscopy, flexible; with removal of                         tumor(s), polyp(s), or other lesion(s) by snare                         technique Diagnosis Code(s):     --- Professional ---                        Z12.11, Encounter for screening for malignant neoplasm                         of colon                        K63.5, Polyp of colon CPT copyright 2019 American Medical Association. All rights reserved. The codes documented in this report are preliminary and upon coder review may  be revised to meet current compliance requirements. Lucilla Lame MD,  MD 10/27/2019 2:25:04 PM This report has been signed electronically. Number of Addenda: 0 Note Initiated On: 10/27/2019 2:00 PM Scope Withdrawal Time: 0 hours 7 minutes 15 seconds  Total Procedure Duration: 0 hours 10 minutes 23 seconds  Estimated Blood Loss:  Estimated blood loss: none.      [facility]

## 2019-10-27 NOTE — Anesthesia Postprocedure Evaluation (Signed)
Anesthesia Post Note  Patient: Vickie Smith  Procedure(s) Performed: COLONOSCOPY WITH PROPOFOL (N/A )  Patient location during evaluation: PACU Anesthesia Type: General Level of consciousness: awake and alert Pain management: pain level controlled Vital Signs Assessment: post-procedure vital signs reviewed and stable Respiratory status: spontaneous breathing, nonlabored ventilation, respiratory function stable and patient connected to nasal cannula oxygen Cardiovascular status: blood pressure returned to baseline and stable Postop Assessment: no apparent nausea or vomiting Anesthetic complications: no     Last Vitals:  Vitals:   10/27/19 1447 10/27/19 1457  BP: 98/75 103/75  Pulse: 83 62  Resp: 14 16  Temp:    SpO2: 99% 100%    Last Pain:  Vitals:   10/27/19 1457  TempSrc:   PainSc: 0-No pain                 Molli Barrows

## 2019-10-27 NOTE — Anesthesia Preprocedure Evaluation (Signed)
Anesthesia Evaluation  Patient identified by MRN, date of birth, ID band Patient awake    Reviewed: Allergy & Precautions, H&P , NPO status , Patient's Chart, lab work & pertinent test results, reviewed documented beta blocker date and time   Airway Mallampati: II   Neck ROM: full    Dental  (+) Poor Dentition   Pulmonary pneumonia, resolved, Patient abstained from smoking.,    Pulmonary exam normal        Cardiovascular Exercise Tolerance: Good negative cardio ROS Normal cardiovascular exam Rhythm:regular Rate:Normal     Neuro/Psych negative neurological ROS  negative psych ROS   GI/Hepatic negative GI ROS, (+) Hepatitis -  Endo/Other  negative endocrine ROSdiabetes, Well Controlled, Type 2, Oral Hypoglycemic Agents, Insulin Dependent  Renal/GU negative Renal ROS  negative genitourinary   Musculoskeletal   Abdominal   Peds  Hematology negative hematology ROS (+)   Anesthesia Other Findings Past Medical History: No date: Aspiration pneumonia of left upper lobe due to gastric  secretions (HCC) No date: Diabetes mellitus without complication ([place]) 11/10/6466: Hematemesis 08/12/2019: Hemoptysis No date: Hypotension No date: Sore throat 08/12/2019: Upper GI bleeding Past Surgical History: 08/15/2019: EMBOLIZATION; N/A     Comment:  Procedure: Bronchial Artery Embolization;  Surgeon:               Katha Cabal, MD;  Location: [place] [facility];               Service: Cardiovascular;  Laterality: N/A; 08/12/2019: ESOPHAGOGASTRODUODENOSCOPY; N/A     Comment:  Procedure: ESOPHAGOGASTRODUODENOSCOPY (EGD);  Surgeon:               Lin Landsman, MD;  Location: [facility] ENDOSCOPY;                Service: Gastroenterology;  Laterality: N/A;   Reproductive/Obstetrics negative OB ROS                             Anesthesia Physical Anesthesia Plan  ASA: III  Anesthesia Plan: General    Post-op Pain Management:    Induction:   PONV Risk Score and Plan:   Airway Management Planned:   Additional Equipment:   Intra-op Plan:   Post-operative Plan:   Informed Consent: I have reviewed the patients History and Physical, chart, labs and discussed the procedure including the risks, benefits and alternatives for the proposed anesthesia with the patient or authorized representative who has indicated his/her understanding and acceptance.     Dental Advisory Given  Plan Discussed with: CRNA  Anesthesia Plan Comments:         Anesthesia Quick Evaluation

## 2019-10-29 ENCOUNTER — Encounter: Payer: Self-pay | Admitting: *Deleted

## 2019-10-29 LAB — SURGICAL PATHOLOGY

## 2019-10-30 ENCOUNTER — Encounter: Payer: Self-pay | Admitting: Gastroenterology

## 2019-11-01 ENCOUNTER — Ambulatory Visit: Payer: MEDICAID | Attending: Internal Medicine

## 2019-11-01 DIAGNOSIS — Z23 Encounter for immunization: Secondary | ICD-10-CM

## 2019-11-01 NOTE — Progress Notes (Signed)
   Covid-19 Vaccination Clinic  Name:  Vickie Smith    MRN: [id number] DOB: 04-22-1966  11/01/2019  Ms. Vickie Smith was observed post Covid-19 immunization for 15 minutes without incident. She was provided with Vaccine Information Sheet and instruction to access the V-Safe system.   Ms. Vickie Smith was instructed to call 911 with any severe reactions post vaccine: Marland Kitchen Difficulty breathing  . Swelling of face and throat  . A fast heartbeat  . A bad rash all over body  . Dizziness and weakness   Immunizations Administered    Name Date Dose VIS Date Route   Pfizer COVID-19 Vaccine 11/01/2019 10:41 AM 0.3 mL 09/03/2018 Intramuscular   Manufacturer: [employer], [employer]   Lot: [id number]   [place]: [id number]

## 2019-12-03 ENCOUNTER — Ambulatory Visit: Payer: Self-pay | Admitting: Gastroenterology

## 2019-12-03 ENCOUNTER — Encounter: Payer: Self-pay | Admitting: *Deleted

## 2020-08-14 IMAGING — CT CT ABDOMEN WO/W CM
2 of 11 series · 11 of 46 positions shown, 17 images · IV contrast (omnipaque)
Comparison: 09/01/2019 abdominal ultrasound. No comparison CT.

CLINICAL DATA: Cirrhosis. Occasional lower abdominal pain.

EXAM:
CT ABDOMEN WITHOUT AND WITH CONTRAST
TECHNIQUE: Multidetector CT imaging of the abdomen was performed following the
standard protocol before and following the bolus administration of
intravenous contrast.
CONTRAST:  100mL OMNIPAQUE IOHEXOL 300 MG/ML  SOLN

[Series 8: coronal arterial arterial phase liver 2.00 cor · coronal · arterial · 0.49mm/px · 3 of 125 slices shown, 4 images]
[im 32/125  soft-tissue]
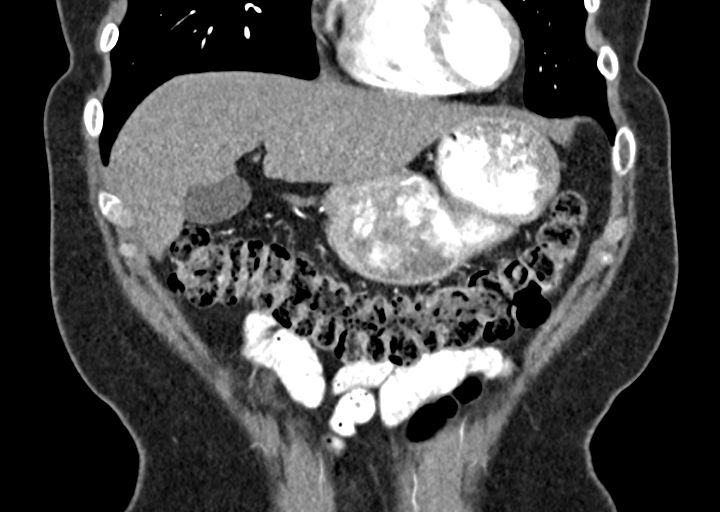
[im 63/125  soft-tissue]
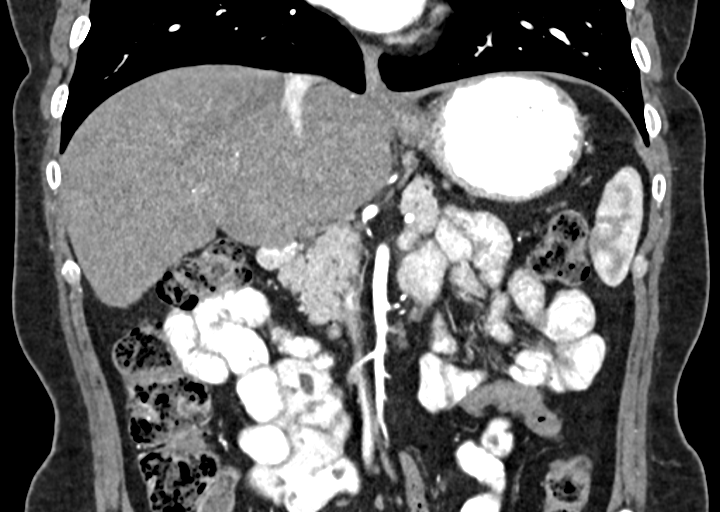
[im 63/125  bone]
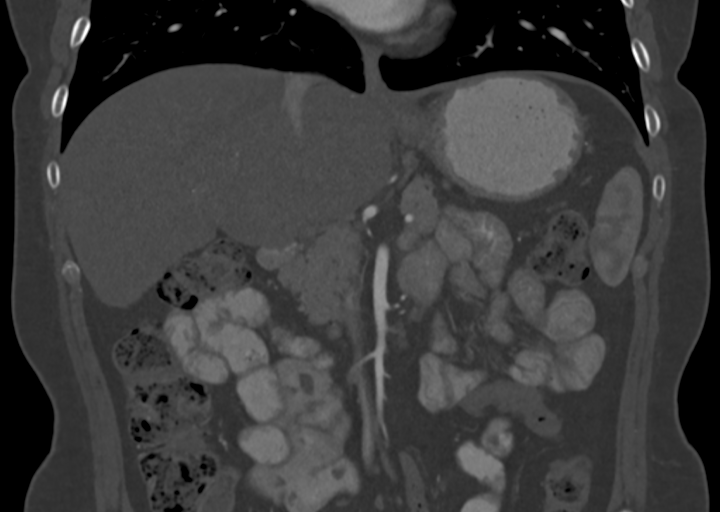
[im 94/125  soft-tissue]
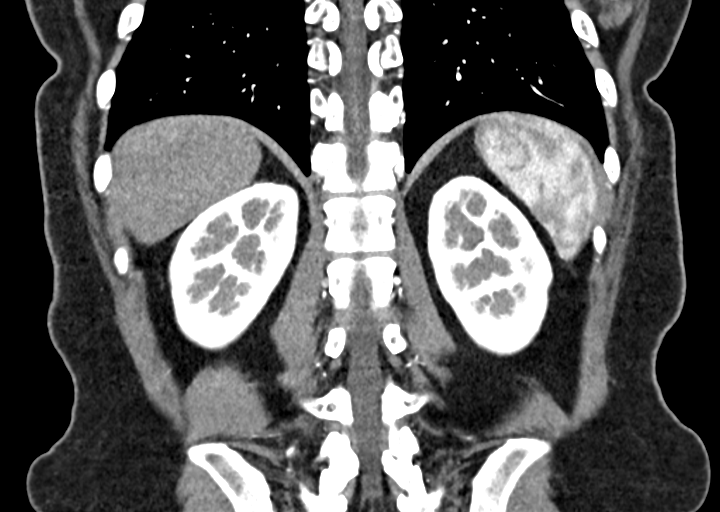

[Series 12: axial venous venous phase liver 2.00 · axial · portal-venous · 0.71mm/px · z∈[-1278,-1084]mm · 8 of 125 slices shown, 13 images]
[im 14/125  soft-tissue]
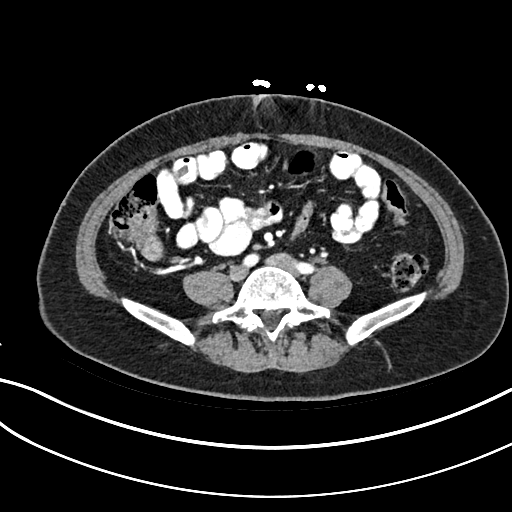
[im 14/125  bone]
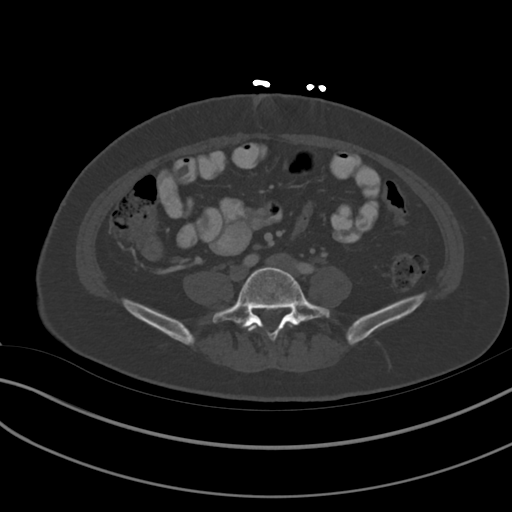
[im 28/125  soft-tissue]
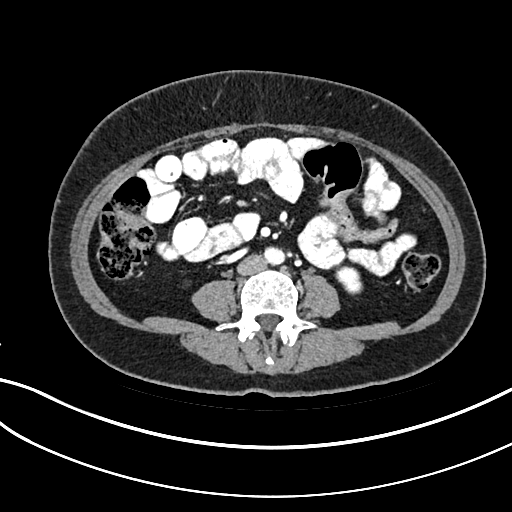
[im 42/125  soft-tissue]
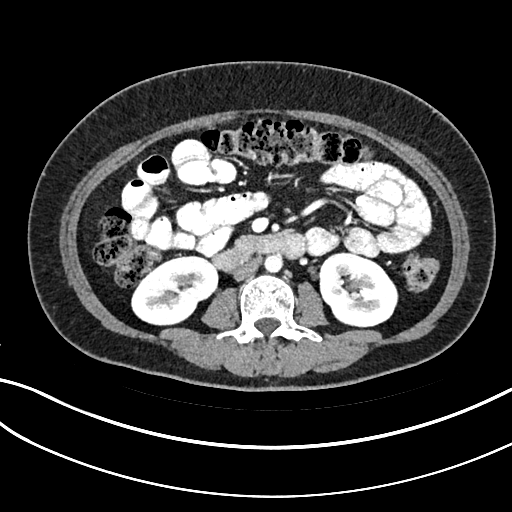
[im 56/125  soft-tissue]
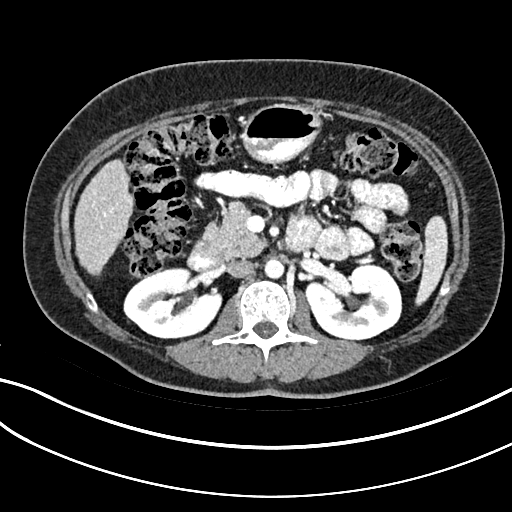
[im 69/125  soft-tissue]
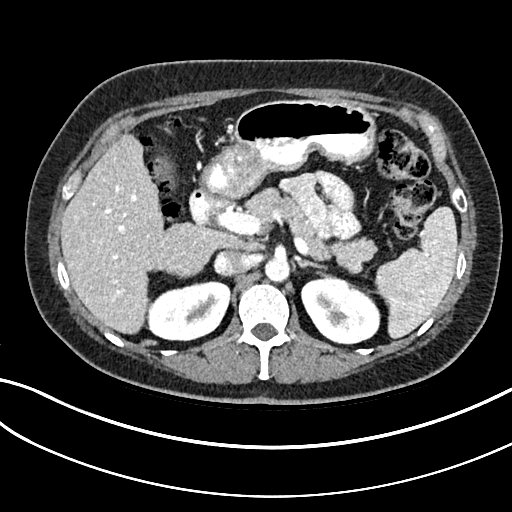
[im 69/125  lung]
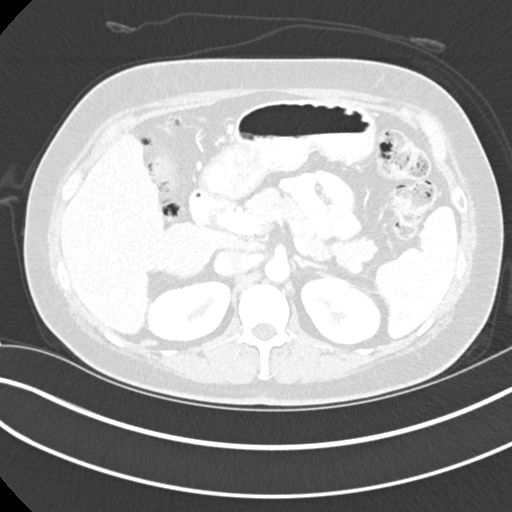
[im 83/125  soft-tissue]
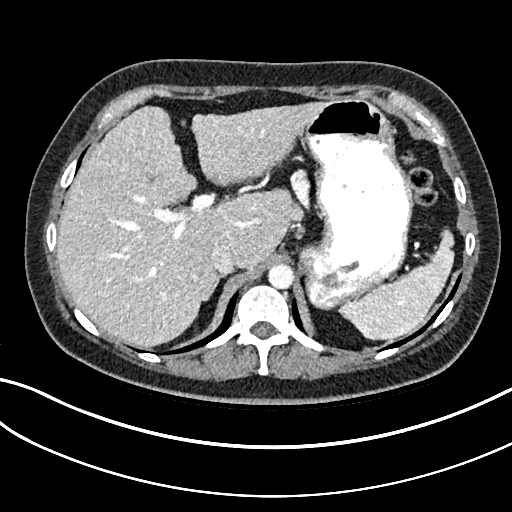
[im 83/125  lung]
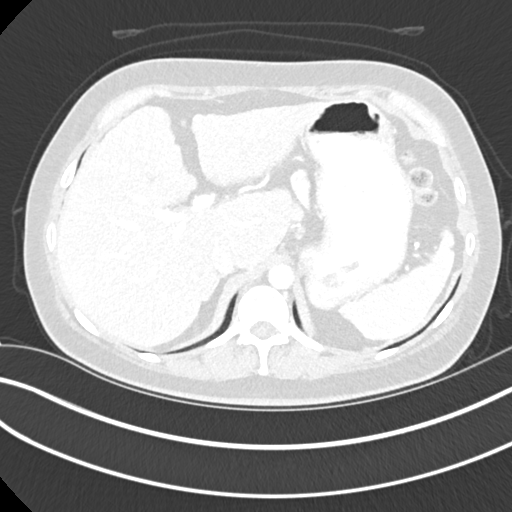
[im 97/125  soft-tissue]
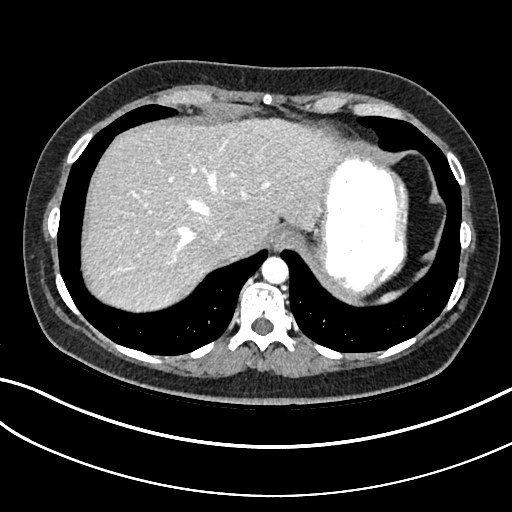
[im 97/125  lung]
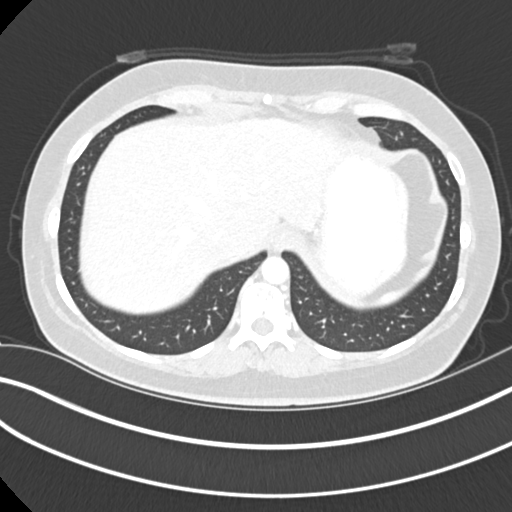
[im 111/125  soft-tissue]
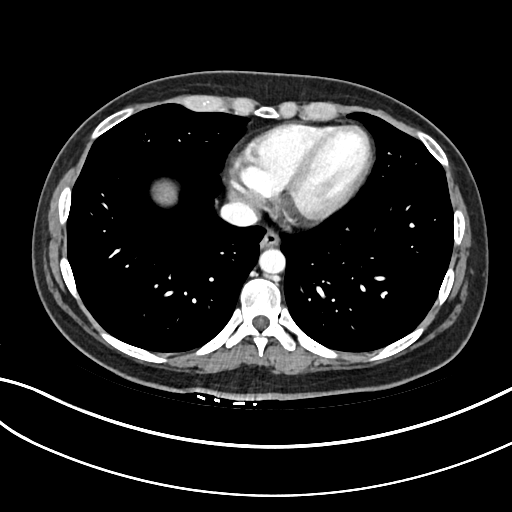
[im 111/125  lung]
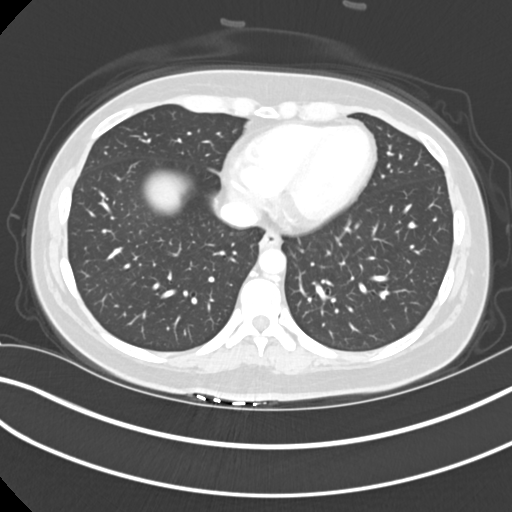

[11 of 46 positions shown; findings below may reference images not displayed]

FINDINGS: Lower chest: Clear lung bases. Normal heart size without pericardial
or pleural effusion.

Hepatobiliary: Moderate cirrhosis, as evidenced by irregular hepatic
capsule and significant caudate lobe enlargement.

The arterial phase images are somewhat early. Given this limitation,
No arterially hyperenhancing lesions identified.

No areas of portal venous phase or delayed hypoenhancement.

Normal gallbladder, without biliary ductal dilatation.

Pancreas: Normal, without mass or ductal dilatation.

Spleen: Normal in size, without focal abnormality.

Adrenals/Urinary Tract: Normal adrenal glands. No renal calculi or
hydronephrosis. No enhancing renal mass on post-contrast imaging.

Stomach/Bowel: Normal stomach, without wall thickening. Normal colon
and terminal ileum. Normal abdominal small bowel.

Vascular/Lymphatic: Aortic atherosclerosis. No splenic artery
aneurysm. Patent hepatic, portal, splenic veins. No specific
evidence of portal venous hypertension. No abdominal adenopathy.

Other: No ascites.

Musculoskeletal: No acute osseous abnormality.
IMPRESSION: Moderate cirrhosis, without evidence of hepatocellular carcinoma. No
correlate for the ultrasound abnormality.

Aortic Atherosclerosis ([id number]-G93.3).

## 2020-08-24 ENCOUNTER — Emergency Department
Admission: EM | Admit: 2020-08-24 | Discharge: 2020-08-24 | Disposition: A | Discharge status: Home / Self Care | Payer: Self-pay | Attending: Emergency Medicine | Admitting: Emergency Medicine

## 2020-08-24 ENCOUNTER — Emergency Department: Payer: Self-pay

## 2020-08-24 ENCOUNTER — Other Ambulatory Visit: Payer: Self-pay

## 2020-08-24 DIAGNOSIS — R519 Headache, unspecified: Secondary | ICD-10-CM | POA: Insufficient documentation

## 2020-08-24 DIAGNOSIS — H53143 Visual discomfort, bilateral: Secondary | ICD-10-CM | POA: Insufficient documentation

## 2020-08-24 DIAGNOSIS — H538 Other visual disturbances: Secondary | ICD-10-CM | POA: Insufficient documentation

## 2020-08-24 DIAGNOSIS — Z794 Long term (current) use of insulin: Secondary | ICD-10-CM | POA: Insufficient documentation

## 2020-08-24 DIAGNOSIS — R42 Dizziness and giddiness: Secondary | ICD-10-CM | POA: Insufficient documentation

## 2020-08-24 DIAGNOSIS — Z8616 Personal history of COVID-19: Secondary | ICD-10-CM | POA: Insufficient documentation

## 2020-08-24 DIAGNOSIS — E1165 Type 2 diabetes mellitus with hyperglycemia: Secondary | ICD-10-CM | POA: Insufficient documentation

## 2020-08-24 LAB — COMPREHENSIVE METABOLIC PANEL
ALT: 124 U/L — ABNORMAL HIGH (ref 0–44)
AST: 129 U/L — ABNORMAL HIGH (ref 15–41)
Albumin: 4.1 g/dL (ref 3.5–5.0)
Alkaline Phosphatase: 160 U/L — ABNORMAL HIGH (ref 38–126)
Anion gap: 9 (ref 5–15)
BUN: 17 mg/dL (ref 6–20)
CO2: 24 mmol/L (ref 22–32)
Calcium: 9.4 mg/dL (ref 8.9–10.3)
Chloride: 106 mmol/L (ref 98–111)
Creatinine, Ser: <0.3 mg/dL — ABNORMAL LOW (ref 0.44–1.00)
Glucose, Bld: 118 mg/dL — ABNORMAL HIGH (ref 70–99)
Potassium: 3.6 mmol/L (ref 3.5–5.1)
Sodium: 139 mmol/L (ref 135–145)
Total Bilirubin: 0.4 mg/dL (ref 0.3–1.2)
Total Protein: 8.3 g/dL — ABNORMAL HIGH (ref 6.5–8.1)

## 2020-08-24 LAB — DIFFERENTIAL
Abs Immature Granulocytes: 0.01 10*3/uL (ref 0.00–0.07)
Basophils Absolute: 0.1 10*3/uL (ref 0.0–0.1)
Basophils Relative: 1 %
Eosinophils Absolute: 0.2 10*3/uL (ref 0.0–0.5)
Eosinophils Relative: 2 %
Immature Granulocytes: 0 %
Lymphocytes Relative: 40 %
Lymphs Abs: 3.2 10*3/uL (ref 0.7–4.0)
Monocytes Absolute: 0.4 10*3/uL (ref 0.1–1.0)
Monocytes Relative: 6 %
Neutro Abs: 4 10*3/uL (ref 1.7–7.7)
Neutrophils Relative %: 51 %

## 2020-08-24 LAB — CBC
HCT: 42.7 % (ref 36.0–46.0)
Hemoglobin: 14.6 g/dL (ref 12.0–15.0)
MCH: 30.8 pg (ref 26.0–34.0)
MCHC: 34.2 g/dL (ref 30.0–36.0)
MCV: 90.1 fL (ref 80.0–100.0)
Platelets: 103 10*3/uL — ABNORMAL LOW (ref 150–400)
RBC: 4.74 MIL/uL (ref 3.87–5.11)
RDW: 13 % (ref 11.5–15.5)
WBC: 7.9 10*3/uL (ref 4.0–10.5)
nRBC: 0 % (ref 0.0–0.2)

## 2020-08-24 MED ORDER — MECLIZINE HCL 25 MG PO TABS: 25.0000 mg | ORAL_TABLET | Freq: Three times a day (TID) | ORAL | 0 refills | Status: AC | PRN

## 2020-08-24 MED ORDER — MECLIZINE HCL 25 MG PO TABS
25.0000 mg | ORAL_TABLET | Freq: Once | ORAL | Status: AC
  Administered 2020-08-24: 25 mg via ORAL
  Filled 2020-08-24: qty 1

## 2020-08-24 MED ORDER — DROPERIDOL 2.5 MG/ML IJ SOLN
2.5000 mg | Freq: Once | INTRAMUSCULAR | Status: AC
  Administered 2020-08-24: 2.5 mg via INTRAMUSCULAR
  Filled 2020-08-24: qty 2

## 2020-08-24 MED ORDER — DROPERIDOL 2.5 MG/ML IJ SOLN: 2.5000 mg | Freq: Once | INTRAMUSCULAR | Status: DC

## 2020-08-24 MED ORDER — ACETAMINOPHEN 500 MG PO TABS
1000.0000 mg | ORAL_TABLET | Freq: Once | ORAL | Status: AC
  Administered 2020-08-24: 1000 mg via ORAL
  Filled 2020-08-24: qty 2

## 2020-08-24 NOTE — ED Triage Notes (Signed)
Pt comes pov with dizziness and AMS since last Tuesday but it hasn't gone away. Pt states that it feels like the room is spinning. Denies hx of vertigo.

## 2020-08-24 NOTE — ED Notes (Signed)
Patient transported to MRI 

## 2020-08-24 NOTE — Discharge Instructions (Addendum)
You are being discharged with a prescription for meclizine.  This is a medication to take as needed to help with vertigo and dizziness.  If you develop any worsening dizzy symptoms, particularly with passing out, fever or vomiting, please return to the ED.

## 2020-08-24 NOTE — ED Notes (Addendum)
messaged Vickie Smith about CT-no CT at this time

## 2020-08-24 NOTE — ED Provider Notes (Signed)
[facility] Emergency Department Provider Note ____________________________________________   Event Date/Time   First MD Initiated Contact with Patient 08/24/20 1450     (approximate)  I have reviewed the triage vital signs and the nursing notes.  HISTORY  Chief Complaint Altered Mental Status   HPI Vickie Smith is a 55 y.o. femalewho presents to the ED for evaluation of dizziness.  Chart review indicates history of DM, HTN and HLD.  Vickie Smith cirrhosis  Patient presents to the ED with 1 week of persistent vertiginous dizziness, headache, bilateral blurry vision and photophobia.  She reports that she has never felt this sensation before.  She reports that she has felt this dizziness and vertigo constantly for the past 1 week.  Has not taken any medications for this.  Denies any associated photophobia or headache.  She reports her symptoms are worse when she moves her head, she reports feeling better when she lays still with her eyes closed.  She denies any falls, trauma, headache, fever, neck stiffness, syncopal episodes  Past Medical History:  Diagnosis Date  . Aspiration pneumonia of left upper lobe due to gastric secretions ([place])   . COVID-19   . Diabetes mellitus without complication ([place])   . Hematemesis 08/12/2019  . Hemoptysis 08/12/2019  . Hypotension   . Sore throat   . Upper GI bleeding 08/12/2019    Patient Active Problem List   Diagnosis Date Noted  . Screening for colon cancer   . Polyp of ascending colon   . Liver cirrhosis secondary to NASH ([place]) 10/01/2019  . Uncontrolled type 2 diabetes mellitus with hyperglycemia ([place])   . Thrombocytopenia ([place]) 08/14/2019  . Diabetes mellitus without complication ([place])   . Abnormal LFTs     Past Surgical History:  Procedure Laterality Date  . COLONOSCOPY WITH PROPOFOL N/A 10/27/2019   Procedure: COLONOSCOPY WITH PROPOFOL;  Surgeon: Lucilla Lame, MD;  Location: [facility] ENDOSCOPY;  Service: Endoscopy;   Laterality: N/A;  . EMBOLIZATION N/A 08/15/2019   Procedure: Bronchial Artery Embolization;  Surgeon: Katha Cabal, MD;  Location: [place] CV LAB;  Service: Cardiovascular;  Laterality: N/A;  . ESOPHAGOGASTRODUODENOSCOPY N/A 08/12/2019   Procedure: ESOPHAGOGASTRODUODENOSCOPY (EGD);  Surgeon: Lin Landsman, MD;  Location: [facility] ENDOSCOPY;  Service: Gastroenterology;  Laterality: N/A;    Prior to Admission medications   Medication Sig Start Date End Date Taking? Authorizing Provider  meclizine (ANTIVERT) 25 MG tablet Take 1 tablet (25 mg total) by mouth 3 (three) times daily as needed for dizziness. 08/24/20  Yes Vladimir Crofts, MD  acetaminophen (TYLENOL) 325 MG tablet Take 2 tablets (650 mg total) by mouth every 4 (four) hours as needed for mild pain (temp > 101.5). 08/17/19   Bradly Bienenstock, NP  empagliflozin (JARDIANCE) 25 MG TABS tablet Take 25 mg by mouth daily.    [provider]  Insulin Glargine (BASAGLAR KWIKPEN) 100 UNIT/ML SOPN Inject 0.08 mLs (8 Units total) into the skin daily. On 2/13 decrease your dose to 5 units daily 08/20/19   Noe Gens L, NP  Insulin Pen Needle 32G X 4 MM MISC Inject 1 Container into the skin daily. Use one new needle daily for insulin injection. 08/20/19   Donita Brooks, NP  menthol-cetylpyridinium (CEPACOL) 3 MG lozenge Take 1 lozenge (3 mg total) by mouth 2 (two) times daily. 08/20/19   Donita Brooks, NP  metFORMIN (GLUCOPHAGE) 500 MG tablet Take 1 tablet (500 mg total) by mouth 2 (two) times daily with  a meal. Patient not taking: Reported on 10/27/2019 08/20/19 08/19/20  Donita Brooks, NP  omeprazole (PRILOSEC) 40 MG capsule Take 1 capsule (40 mg total) by mouth in the morning and at bedtime. Patient not taking: Reported on 10/27/2019 10/01/19   Lin Landsman, MD  ondansetron (ZOFRAN) 4 MG tablet Take 1 tablet (4 mg total) by mouth every 8 (eight) hours as needed for nausea or vomiting. Patient not taking: Reported on 10/27/2019  10/01/19   Lin Landsman, MD  pravastatin (PRAVACHOL) 20 MG tablet Take 20 mg by mouth daily.    [provider]  predniSONE (DELTASONE) 20 MG tablet 2 tabs for 2 days, then 1 tab for 2 days, then 1/2 tab for 2 days and stop Patient not taking: Reported on 10/01/2019 08/20/19   Donita Brooks, NP    Allergies Patient has no known allergies.  History reviewed. No pertinent family history.  Social History Social History   Tobacco Use  . Smoking status: Never Smoker  . Smokeless tobacco: Never Used  Vaping Use  . Vaping Use: Never used  Substance Use Topics  . Alcohol use: Never  . Drug use: Never    Review of Systems  Constitutional: No fever/chills Eyes: No visual changes. ENT: No sore throat. Cardiovascular: Denies chest pain. Respiratory: Denies shortness of breath. Gastrointestinal: No abdominal pain.  No nausea, no vomiting.  No diarrhea.  No constipation. Genitourinary: Negative for dysuria. Musculoskeletal: Negative for back pain. Skin: Negative for rash. Neurological: Negative for headaches, focal weakness or numbness.  Positive for dizziness and vertigo.   ____________________________________________   PHYSICAL EXAM:  VITAL SIGNS: Vitals:   08/24/20 1533 08/24/20 1918  BP: 108/81 104/71  Pulse: 61 (!) 57  Resp: 16 16  Temp: 98.9 F (37.2 C) 97.7 F (36.5 C)  SpO2: 100% 98%     Constitutional: Alert and oriented.  Appears uncomfortable, preferring to keep her eyes closed.  Conversational in full sentences via interpreter. Eyes: Conjunctivae are normal. PERRL. EOMI. Head: Atraumatic. Nose: No congestion/rhinnorhea. Mouth/Throat: Mucous membranes are moist.  Oropharynx non-erythematous. Neck: No stridor. No cervical spine tenderness to palpation. Cardiovascular: Normal rate, regular rhythm. Grossly normal heart sounds.  Good peripheral circulation. Respiratory: Normal respiratory effort.  No retractions. Lungs CTAB. Gastrointestinal:  Soft , nondistended, nontender to palpation. No CVA tenderness. Musculoskeletal: No lower extremity tenderness nor edema.  No joint effusions. No signs of acute trauma. Neurologic:  Normal speech and language. No gross focal neurologic deficits are appreciated. Cranial nerves II through XII intact 5/5 strength and sensation in all 4 extremities Skin:  Skin is warm, dry and intact. No rash noted. Psychiatric: Mood and affect are normal. Speech and behavior are normal. ____________________________________________   LABS (all labs ordered are listed, but only abnormal results are displayed)  Labs Reviewed  CBC - Abnormal; Notable for the following components:      Result Value   Platelets 103 (*)    All other components within normal limits  COMPREHENSIVE METABOLIC PANEL - Abnormal; Notable for the following components:   Glucose, Bld 118 (*)    Creatinine, Ser <0.30 (*)    Total Protein 8.3 (*)    AST 129 (*)    ALT 124 (*)    Alkaline Phosphatase 160 (*)    All other components within normal limits  DIFFERENTIAL  PROTIME-INR  APTT  CBG MONITORING, ED   ____________________________________________  12 Lead EKG  Sinus rhythm, rate of 65 bpm.  Leftward axis.  Normal intervals.  No evidence of acute ischemia. ____________________________________________  RADIOLOGY  ED MD interpretation: MRI brain without evidence of acute intracranial pathology or CVA  Official radiology report(s): MR BRAIN WO CONTRAST  Result Date: 08/24/2020 CLINICAL DATA:  Dizziness. EXAM: MRI HEAD WITHOUT CONTRAST TECHNIQUE: Multiplanar, multiecho pulse sequences of the brain and surrounding structures were obtained without intravenous contrast. COMPARISON:  None. FINDINGS: Brain: There is no evidence of an acute infarct, intracranial hemorrhage, mass, midline shift, or extra-axial fluid collection. The ventricles and sulci are normal. The brain is normal in signal. Vascular: Major intracranial vascular  flow voids are preserved. Skull and upper cervical spine: Unremarkable bone marrow signal para Sinuses/Orbits: Unremarkable orbits. Paranasal sinuses and mastoid air cells are clear. Other: None. IMPRESSION: Negative brain MRI. Electronically Signed   By: Logan Bores M.D.   On: 08/24/2020 18:47    ____________________________________________   PROCEDURES and INTERVENTIONS  Procedure(s) performed (including Critical Care):  .1-3 Lead EKG Interpretation Performed by: Vladimir Crofts, MD Authorized by: Vladimir Crofts, MD     Interpretation: normal     ECG rate:  62   ECG rate assessment: normal     Rhythm: sinus rhythm     Ectopy: none     Conduction: normal      Medications  acetaminophen (TYLENOL) tablet 1,000 mg (1,000 mg Oral Given 08/24/20 1654)  droperidol (INAPSINE) 2.5 MG/ML injection 2.5 mg (2.5 mg Intramuscular Given 08/24/20 1654)  meclizine (ANTIVERT) tablet 25 mg (25 mg Oral Given 08/24/20 1913)    ____________________________________________   MDM / ED COURSE   55 year old woman presents to the ED with persistent vertiginous dizziness, likely BPPV, and ultimately amenable to outpatient management.  Normal vitals on room air.  Exam demonstrates an uncomfortable-appearing patient has no focal neurologic deficits, but has persistent vertiginous dizziness that is new for her.  Due to her risk factors and the concern for posterior CVA causing her symptoms, MRI brain obtained and demonstrates no evidence of CVA, ICH or any intracranial pathology.  Her symptoms are resolving after meclizine and droperidol, she is ambulatory and looks improved clinically.  Considering her benign work-up and improving symptoms, I see no evidence of pathology to preclude outpatient management.  We discussed return precautions for the ED prior to discharge.  Clinical Course as of 08/24/20 1929  Tue Aug 24, 2020  1712 Patient ambulates with assistance to the restroom.  Appears unsteady on her feet [DS]   0814 Returned from MRI [DS]  1858 Reassessed with the assistance of video interpreter.  Patient reports improving symptoms with mild residual vertigo.  She is requesting discharge.  We discussed the addition of meclizine to her regimen, and she is agreeable.  We discussed return precautions for the ED. [DS]    Clinical Course User Index [DS] Vladimir Crofts, MD    ____________________________________________   FINAL CLINICAL IMPRESSION(S) / ED DIAGNOSES  Final diagnoses:  Dizziness  Vertigo     ED Discharge Orders         Ordered    meclizine (ANTIVERT) 25 MG tablet  3 times daily PRN        08/24/20 1900           Brandelyn Henne Tamala Julian   Note:  This document was prepared using Dragon voice recognition software and may include unintentional dictation errors.   Vladimir Crofts, MD 08/24/20 [phone]

## 2020-12-29 ENCOUNTER — Other Ambulatory Visit: Payer: Self-pay

## 2020-12-29 ENCOUNTER — Encounter: Payer: Self-pay | Admitting: Physical Therapy

## 2020-12-29 ENCOUNTER — Ambulatory Visit: Payer: Self-pay | Attending: Pediatrics | Admitting: Physical Therapy

## 2020-12-29 DIAGNOSIS — M6281 Muscle weakness (generalized): Secondary | ICD-10-CM | POA: Insufficient documentation

## 2020-12-29 DIAGNOSIS — R278 Other lack of coordination: Secondary | ICD-10-CM | POA: Insufficient documentation

## 2020-12-29 NOTE — Therapy (Signed)
[place] [facility] [address] [place], [place], [address] Phone: [phone]   Fax:  [phone]  Physical Therapy Evaluation (Deferred)  Patient Details  Name: Vickie Smith MRN: [id number] Date of Birth: 05/19/1966 Referring Provider (PT): Juanell Fairly   Encounter Date: 12/29/2020   PT End of Session - 12/29/20 1646     Visit Number 0    Number of Visits 0    Date for PT Re-Evaluation --    PT Start Time 1650    PT Stop Time 1730    PT Time Calculation (min) 40 min    Activity Tolerance Patient limited by pain;Treatment limited secondary to medical complications (Comment)    Behavior During Therapy [facility] for tasks assessed/performed             Past Medical History:  Diagnosis Date   Aspiration pneumonia of left upper lobe due to gastric secretions ([place])    COVID-19    Diabetes mellitus without complication ([place])    Hematemesis 08/12/2019   Hemoptysis 08/12/2019   Hypotension    Sore throat    Upper GI bleeding 08/12/2019    Past Surgical History:  Procedure Laterality Date   COLONOSCOPY WITH PROPOFOL N/A 10/27/2019   Procedure: COLONOSCOPY WITH PROPOFOL;  Surgeon: Lucilla Lame, MD;  Location: [facility] ENDOSCOPY;  Service: Endoscopy;  Laterality: N/A;   EMBOLIZATION N/A 08/15/2019   Procedure: Bronchial Artery Embolization;  Surgeon: Katha Cabal, MD;  Location: [place] CV LAB;  Service: Cardiovascular;  Laterality: N/A;   ESOPHAGOGASTRODUODENOSCOPY N/A 08/12/2019   Procedure: ESOPHAGOGASTRODUODENOSCOPY (EGD);  Surgeon: Lin Landsman, MD;  Location: [facility] ENDOSCOPY;  Service: Gastroenterology;  Laterality: N/A;    There were no vitals filed for this visit.        [facility] PT Assessment - 12/29/20 0001       Assessment   Medical Diagnosis POP    Referring Provider (PT) Juanell Fairly    Hand Dominance Right    Prior Therapy None for this dx      Balance Screen   Has the patient fallen in the past 6 months No             PELVIC HEALTH PHYSICAL THERAPY EVALUATION  SCREENING Red Flags: None Have you had any night sweats? Unexplained weight loss?  Saddle anesthesia? Unexplained changes in bowel or bladder habits?  Precautions: pelvic organ prolapse  SUBJECTIVE  Chief Complaint: Patient reports that she was fine but since the accident at work, she has felt something dropped inside. The accident involved lifting heavy boxes at work. When she went to the bathroom after lifting the boxes, she had what she believed to be organ tissue that had fallen out of the vaginal opening which she pushed back inside. She has a lot of concern about this. Patient was given pain pills which were not particularly useful as she cannot take them and be safe at work. She is presently on work restrictions, but is uncertain how long this will last. She has significant concerns regarding lifting because of the continual heaviness and pressure she feels with any effort. Any time she exerts herself whether a cough, pushing, lifting, or otherwise she feels like something is going to come out. At this time, patient has not had a pelvic exam and is awaiting referral to GYN from PCP. Patient notes that she has considerable pain and discomfort radiating from the pelvis to the abdominal area and around to the low back and  sacrum.   Pertinent History:  Surgical history: Positive for see above.   Patient ambulates with a stiff gait; limited pelvic rotation, shuffling, and apparently antalgic. Patient unable to stand from chair without UE use and wincing with pain.   DPT discussed with patient the likelihood that due to her pelvic organ tissue needing to be pushed back into the body, she needs to be evaluated by a gynecologist/urogynecologist prior to initiating PFPT. Patient educated on treatment for pelvic organ prolapse including: pessary placement, surgical intervention, support garments, and pelvic floor muscle strengthening.  Patient provided with handouts on pelvic organ support garments and "the knack" with transfers and lifting to prevent progression of prolapse. Patient agreed to defer physical therapy evaluation until she has had further medical examination including a thorough pelvic assessment.         Visit Diagnosis: Muscle weakness (generalized)  Other lack of coordination     Problem List Patient Active Problem List   Diagnosis Date Noted   Screening for colon cancer    Polyp of ascending colon    Liver cirrhosis secondary to NASH ([place]) 10/01/2019   Uncontrolled type 2 diabetes mellitus with hyperglycemia (H[facility])    Thrombocytopenia ([place]) 08/14/2019   Diabetes mellitus without complication ([place])    Abnormal LFTs     Myles Gip PT, DPT [id number]  12/29/2020, 5:40 PM   [facility] [address] [place], [place], [address] Phone: [phone]   Fax:  [phone]  Name: Vickie Smith MRN: [id number] Date of Birth: 05-21-1966

## 2021-07-16 ENCOUNTER — Other Ambulatory Visit: Payer: Self-pay

## 2021-07-16 ENCOUNTER — Emergency Department: Payer: Self-pay

## 2021-07-16 DIAGNOSIS — J069 Acute upper respiratory infection, unspecified: Secondary | ICD-10-CM | POA: Insufficient documentation

## 2021-07-16 DIAGNOSIS — Z20822 Contact with and (suspected) exposure to covid-19: Secondary | ICD-10-CM | POA: Insufficient documentation

## 2021-07-16 LAB — CBC WITH DIFFERENTIAL/PLATELET
Abs Immature Granulocytes: 0.02 10*3/uL (ref 0.00–0.07)
Basophils Absolute: 0.1 10*3/uL (ref 0.0–0.1)
Basophils Relative: 1 %
Eosinophils Absolute: 0.1 10*3/uL (ref 0.0–0.5)
Eosinophils Relative: 2 %
HCT: 39.2 % (ref 36.0–46.0)
Hemoglobin: 14 g/dL (ref 12.0–15.0)
Immature Granulocytes: 0 %
Lymphocytes Relative: 28 %
Lymphs Abs: 1.7 10*3/uL (ref 0.7–4.0)
MCH: 31.3 pg (ref 26.0–34.0)
MCHC: 35.7 g/dL (ref 30.0–36.0)
MCV: 87.7 fL (ref 80.0–100.0)
Monocytes Absolute: 0.6 10*3/uL (ref 0.1–1.0)
Monocytes Relative: 9 %
Neutro Abs: 3.8 10*3/uL (ref 1.7–7.7)
Neutrophils Relative %: 60 %
Platelets: 121 10*3/uL — ABNORMAL LOW (ref 150–400)
RBC: 4.47 MIL/uL (ref 3.87–5.11)
RDW: 12.9 % (ref 11.5–15.5)
WBC: 6.3 10*3/uL (ref 4.0–10.5)
nRBC: 0 % (ref 0.0–0.2)

## 2021-07-16 LAB — COMPREHENSIVE METABOLIC PANEL
ALT: 55 U/L — ABNORMAL HIGH (ref 0–44)
AST: 69 U/L — ABNORMAL HIGH (ref 15–41)
Albumin: 3.6 g/dL (ref 3.5–5.0)
Alkaline Phosphatase: 145 U/L — ABNORMAL HIGH (ref 38–126)
Anion gap: 11 (ref 5–15)
BUN: 10 mg/dL (ref 6–20)
CO2: 24 mmol/L (ref 22–32)
Calcium: 9.4 mg/dL (ref 8.9–10.3)
Chloride: 99 mmol/L (ref 98–111)
Creatinine, Ser: 0.39 mg/dL — ABNORMAL LOW (ref 0.44–1.00)
GFR, Estimated: >60 mL/min (ref >60)
Glucose, Bld: 294 mg/dL — ABNORMAL HIGH (ref 70–99)
Potassium: 3.5 mmol/L (ref 3.5–5.1)
Sodium: 134 mmol/L — ABNORMAL LOW (ref 135–145)
Total Bilirubin: 0.5 mg/dL (ref 0.3–1.2)
Total Protein: 7.9 g/dL (ref 6.5–8.1)

## 2021-07-16 LAB — RESP PANEL BY RT-PCR (FLU A&B, COVID) ARPGX2
Influenza A by PCR: NEGATIVE
Influenza B by PCR: NEGATIVE
SARS Coronavirus 2 by RT PCR: NEGATIVE

## 2021-07-16 LAB — TROPONIN I (HIGH SENSITIVITY): Troponin I (High Sensitivity): 3 ng/L (ref <18)

## 2021-07-16 NOTE — ED Triage Notes (Signed)
Pt presents via POV with complaints of a cough and chest congestion for the last 9 days. Pt states she coughs up mucus - she has been taking a "[place]  medicine" to help her expel the phlegm. Pt denies being exposed to anyone sick.

## 2021-07-17 ENCOUNTER — Emergency Department
Admission: EM | Admit: 2021-07-17 | Discharge: 2021-07-17 | Disposition: A | Discharge status: Home / Self Care | Payer: Self-pay | Attending: Emergency Medicine | Admitting: Emergency Medicine

## 2021-07-17 DIAGNOSIS — J069 Acute upper respiratory infection, unspecified: Secondary | ICD-10-CM

## 2021-07-17 MED ORDER — ALBUTEROL SULFATE HFA 108 (90 BASE) MCG/ACT IN AERS: INHALATION_SPRAY | RESPIRATORY_TRACT | 0 refills | Status: AC

## 2021-07-17 MED ORDER — BENZONATATE 100 MG PO CAPS: 100.0000 mg | ORAL_CAPSULE | Freq: Three times a day (TID) | ORAL | 0 refills | Status: AC | PRN

## 2021-07-17 MED ORDER — HYDROCOD POLST-CPM POLST ER 10-8 MG/5ML PO SUER: 5.0000 mL | Freq: Two times a day (BID) | ORAL | 0 refills | Status: AC | PRN

## 2021-07-17 MED ORDER — BENZONATATE 100 MG PO CAPS
100.0000 mg | ORAL_CAPSULE | Freq: Once | ORAL | Status: AC
  Administered 2021-07-17: 100 mg via ORAL
  Filled 2021-07-17: qty 1

## 2021-07-17 MED ORDER — HYDROCOD POLST-CPM POLST ER 10-8 MG/5ML PO SUER
5.0000 mL | Freq: Once | ORAL | Status: AC
  Administered 2021-07-17: 5 mL via ORAL
  Filled 2021-07-17: qty 5

## 2021-07-17 NOTE — ED Provider Notes (Signed)
[facility] Provider Note    Event Date/Time   First MD Initiated Contact with Patient 07/17/21 0005     (approximate)   History   Cough  The patient and/or family speak(s) Spanish.  They understand they have the right to the use of a hospital interpreter, however at this time they prefer to speak directly with me in [place].  They know that they can ask for an interpreter at any time.   HPI  Vickie Smith is a 56 y.o. female whose medical history is notable for hypertension and diabetes but no smoking or history of lung disease.  She presents for evaluation of about 9 days of persistent and severe cough.  It is occasionally productive.  She said that it started about 9 days ago with generalized body aches and fever, but those symptoms resolved after about 2 days.  However the cough is persisted as has a mild sore throat.  She is not short of breath except when coughing.  Exertion makes the symptoms worse, nothing in particular makes them better.  She denies chest pain other than a mild aching when coughing.  She denies nausea, vomiting, and abdominal pain.     Physical Exam   Triage Vital Signs: ED Triage Vitals  Enc Vitals Group     BP 07/16/21 2025 107/77     Pulse Rate 07/16/21 2025 97     Resp 07/16/21 2025 20     Temp 07/16/21 2025 99.3 F (37.4 C)     Temp Source 07/16/21 2025 Oral     SpO2 07/16/21 2025 98 %     Weight 07/16/21 2015 52.2 kg (115 lb)     Height 07/16/21 2015 1.499 m (4' 11" )     Head Circumference --      Peak Flow --      Pain Score 07/16/21 2016 8     Pain Loc --      Pain Edu? --      Excl. in [place]? --     Most recent vital signs: Vitals:   07/16/21 2025 07/17/21 0123  BP: 107/77 110/79  Pulse: 97 90  Resp: 20 20  Temp: 99.3 F (37.4 C) 98.9 F (37.2 C)  SpO2: 98% 98%     General: Awake, no distress except for frequent cough. CV:  Good peripheral perfusion.  Resp:  Normal effort.  Lungs are clear to  auscultation bilaterally in spite of frequent cough. Abd:  No distention.  Other:  No focal neurological deficits appreciated.   ED Results / Procedures / Treatments   Labs (all labs ordered are listed, but only abnormal results are displayed) Labs Reviewed  CBC WITH DIFFERENTIAL/PLATELET - Abnormal; Notable for the following components:      Result Value   Platelets 121 (*)    All other components within normal limits  COMPREHENSIVE METABOLIC PANEL - Abnormal; Notable for the following components:   Sodium 134 (*)    Glucose, Bld 294 (*)    Creatinine, Ser 0.39 (*)    AST 69 (*)    ALT 55 (*)    Alkaline Phosphatase 145 (*)    All other components within normal limits  RESP PANEL BY RT-PCR (FLU A&B, COVID) ARPGX2  TROPONIN I (HIGH SENSITIVITY)  TROPONIN I (HIGH SENSITIVITY)     EKG  ED ECG REPORT I, Hinda Kehr, the attending physician, personally viewed and interpreted this ECG.  Date: 07/16/2021 EKG Time: 20:23 Rate: 98 Rhythm:  normal sinus rhythm QRS Axis: normal Intervals: Right bundle branch block ST/T Wave abnormalities: Non-specific ST segment / T-wave changes, but no clear evidence of acute ischemia. Narrative Interpretation: no definitive evidence of acute ischemia; does not meet STEMI criteria.   RADIOLOGY I personally reviewed the patient's chest x-ray and I see no evidence of lobar pneumonia or pulmonary edema.  The radiologist agrees based on the written report.    PROCEDURES:  Critical Care performed: No  Procedures   MEDICATIONS ORDERED IN ED: Medications  chlorpheniramine-HYDROcodone (TUSSIONEX) 10-8 MG/5ML suspension 5 mL (5 mLs Oral Given 07/17/21 0105)  benzonatate (TESSALON) capsule 100 mg (100 mg Oral Given 07/17/21 0105)     IMPRESSION / MDM / ASSESSMENT AND PLAN / ED COURSE  I reviewed the triage vital signs and the nursing notes.                              Differential diagnosis includes, but is not limited to, viral illness,  community-acquired pneumonia, CHF.   Vital signs are stable and within normal limits.  I personally reviewed the patient's chest x-ray and there is no evidence of pneumonia.  No evidence of ischemia on EKG.  Labs ordered as part of the evaluation and include troponin, comprehensive metabolic panel, CBC, and respiratory viral panel.  I reviewed the results and although she has slightly elevated LFTs, these are stable from prior values.  CBC is within normal limits including no leukocytosis.  High-sensitivity troponin is negative and she is not having chest pain.  Respiratory viral panel is negative.  However based on her description, she either had influenza or another viral infection and has a persistent cough.  I gave her a dose of Tessalon and Tussionex in the emergency department.  I am writing prescriptions as listed below which she will follow-up as an outpatient.  I considered admission for this patient but given her overall reassuring evaluation, normal vital signs, and symptoms confined to persistent cough, I do not think she would benefit from admission and is appropriate for outpatient follow-up.  She agrees with the plan and I gave my usual and customary return precautions.        FINAL CLINICAL IMPRESSION(S) / ED DIAGNOSES   Final diagnoses:  Viral URI with cough     Rx / DC Orders   ED Discharge Orders          Ordered    chlorpheniramine-HYDROcodone (TUSSIONEX PENNKINETIC ER) 10-8 MG/5ML SUER  Every 12 hours PRN        07/17/21 0154    benzonatate (TESSALON PERLES) 100 MG capsule  3 times daily PRN        07/17/21 0154    albuterol (VENTOLIN HFA) 108 (90 Base) MCG/ACT inhaler       Note to Pharmacy: Pharmacy may substitute brand and size for insurance-approved equivalent   07/17/21 0154             Note:  This document was prepared using Dragon voice recognition software and may include unintentional dictation errors.   Hinda Kehr, MD 07/17/21 [phone]

## 2022-04-21 ENCOUNTER — Other Ambulatory Visit: Payer: Self-pay | Admitting: Nurse Practitioner

## 2022-04-21 ENCOUNTER — Ambulatory Visit
Admission: RE | Admit: 2022-04-21 | Discharge: 2022-04-21 | Disposition: A | Discharge status: Home / Self Care | Payer: Self-pay | Source: Ambulatory Visit | Attending: Nurse Practitioner | Admitting: Nurse Practitioner

## 2022-04-21 ENCOUNTER — Ambulatory Visit
Admission: RE | Admit: 2022-04-21 | Discharge: 2022-04-21 | Disposition: A | Discharge status: Home / Self Care | Payer: Self-pay | Attending: Nurse Practitioner | Admitting: Nurse Practitioner

## 2022-04-21 DIAGNOSIS — M545 Low back pain, unspecified: Secondary | ICD-10-CM | POA: Insufficient documentation

## 2024-05-09 NOTE — Progress Notes (Signed)
 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 SGLT2i Jardiance, Januvia         Confirms she has glucose meter and has been checking 2x daily, fasting and in the evening        By patient report: BS Fasting in AM  367 => 262                                    BS in PM:  ? 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 Jacqulyn Plain  - Fibrosis Stage 3 (NAS 5-6   Diagnosis  A: Liver, biopsy - Steatohepatitis with bridging fibrosis, stage 3 (Brunt system), NAS score 5-6 (steatosis 1-2; lobular inflammation 2; ballooning 2)  - Focal copper accumulation and mild bile ductular reaction, suggesting chronic cholestasis  - See comment   Fragmented liver biopsy demonstrates patchy steatosis (approximately 10-40%) in a mixed macrovesicular and microvesicular pattern, accompanied by significant hepatocyte ballooning, Mallory hyalins, and mild portal and lobular inflammation. Perivenular/pericellular and periportal fibrosis with bridging is present, confirmed by trichrome stain. Complete nodular formation is not evident in the current biopsy. Reticulin stain highlights preserved liver plate thickness. CK7 immunostain shows mild ductular  reaction, and Rhodanine stain reveals focal copper accumulation, which can be secondary to advanced fibrosis. Iron stain shows no increased iron deposition. PAS-diastase stain is negative for alpha-1 antitrypsin-like globules.  Assessment & Plan # Type 2 diabetes mellitus with poor glycemic control and diabetic neuropathy POC A1c 11%. Neuropathy symptoms present. Discussed insulin  initiation due to poor glycemic control. Considered once-weekly medications but contraindicated due to possible pancreatitis history. - Increase Lantus  insulin  to 10 units subcutaneously at night. - Continue metformin , Januvia, and Jardiance as prescribed. - Send updated prescription to [facility]. - Instruct to bring glucose meter to appointments for monitoring. - Schedule follow-up appointment in 6 weeks to assess response to increased insulin  dose.  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 Lesions appear non-infectious.- Monitor for improvement as blood glucose levels are controlled.  # MASH with Fibrosis Stage 3 - c/b esophageal varices, and secondary thrombocytopenia  Liver biopsy showed steatohepatitis with fibrosis stage 3, mild copper accumulation, and mild bile ductular reaction suggestive of chronic cholestasis. Further evaluation with MRI and blood tests needed. - Attempt to draw blood tests ordered by [facility] today, if possible. - Recommended pt have MRI ordered by Dr. Plain prior to follow-up with liver specialist  on December 2nd.  # Chronic abdominal pain and nausea - admitted in May 2025 to [facility] with  Chronic abdominal pain and nausea, possibly related to constipation. Past imaging showed mild biliary ductal dilatation and cirrhotic liver morphology. Pancreas normal appearing  # Pancreatitis ? - note of mild pancreatitis in the past but pancreas appears normal on imaging  - diagnosis appears to be based largely on  very mildly elevated lipase and abd pain with nausea that is chronic - no h/o cholelithiasis    Follow up in about 6 weeks (around 06/20/2024).  Patient Instructions    Dr. Jacqulyn Plain  [id number]   Orders Placed This Encounter  Procedures  . GAD65 Antibody  . Islet Cell Antibody  . Zinc Transporter 8 (ZnT8) Antibody, Serum   FOLLOW UP PLAN:  Follow up in about 6 weeks (around 06/20/2024).   Visit Duration  I personally spent 40 minutes face-to-face and non-face-to-face in the care of this patient, which includes all pre, intra, and post visit time on the date of service.  Billing based on medical decision making.     Alfonso CORDOBA Consuella, MD  [facility] [address] [place] [address] Phone: [phone] Fax: [phone]    Note - This record has been created using Dragon software. Chart creation errors have been sought, but may not always have been located.  Such creation errors do not reflect on the standard of medical care.          CC: Type 2 DM.  PCP: Ricard Tawni Slain, MD  Referring provider: Noretta Cave  Subjective:    Vickie Smith is a 58 y.o. female who presents for an initial evaluation of Type 2 diabetes mellitus.   MARRTI translator for Spanish was used during entire visit.   History of Present Illness Vickie Smith is a 58 year old female with type two diabetes who presents for management of her diabetes. She was referred by Dr. Noretta Mages for management of her diabetes after a recent hospitalization.  She has a history of type two diabetes, initially diagnosed as prediabetes, then as DM during her pregnancy 16 years ago, which resolved post-delivery. Her diabetes returned in 2019, diagnosed after experiencing burning and itching in her leg, leading to an urgent care visit where high blood sugar levels were discovered (>1000). Initially treated with insulin  due to extremely high glucose  levels, she has since transitioned to oral medications. Currently, she takes metformin  1000 mg twice daily, Januvia 100 mg daily, and Jardiance 25 mg daily. She ran out of Jardiance 15 days ago. Her last A1c in March 2025 was 8.8%, but it has increased to 11.3% today. She relates that she has been off of Jardiance for a couple of weeks.   She has a history of liver cirrhosis, with a recent hospitalization in May 2025 for abdominal pain, vomiting, and constipation alternating with diarrhea. Imaging showed cirrhotic liver morphology and mild biliary ductal dilatation but no gallstones. Pancreas with normal contour. Adrenal glands appear normal.  She has a history of thrombocytopenia and varices, with a plan for repeat endoscopy. She experiences chronic abdominal pain and nausea, which is relieved with Smog, amina. Her liver enzymes have been elevated but stable. She is followed by the [facility] and has a pending liver biopsy.  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 Her family history includes her father passing from liver disease and her mother having  diabetes.  She experiences frequent headaches over the past three weeks and has a rash on her back, initially thought to be shingles per her PCP Dr. Ricard, causing significant itching and burning. She has not been able to see a dermatologist yet but has follow up with her PCP in a couple of days on Tuesday, Sept 23rd.    05/09/24 CLINIC APPOINTMENT - Spanish Interpreter, MAARTI system   Mckenze Slone is a 58 year old female with type 2 diabetes c/b liver disease (MASH-Stage 3) who presents for follow-up.  She is managing her type 2 diabetes with Jardiance 25 mg daily, Lantus , metformin , and sitagliptin. Her blood sugar levels have decreased from 367 mg/dL to 737 mg/dL in the morning. She checks her blood sugar both in the morning and at night but does not recall the evening values. No episodes of  hypoglycemia have been experienced.  She has a history of liver disease with portal hypertension and esophageal varices. A liver biopsy on October 1st revealed steatohepatitis with fibrosis stage three, mild copper accumulation, and a mild bile ductular reaction suggestive of chronic cholestasis. Further evaluation, including an MRI and blood tests, has been advised, but she has faced difficulties in scheduling these appointments.  She does not have healthcare insurance and obtains her medications from the [facility] pharmacy. She has a history of a mild bout of pancreatitis in May 2025, with a lipase level of 82 U/L, which was slightly elevated. Her lipase levels have ranged from 40 to 83 U/L over the past 10-15 years.  She experiences pain in her leg bones and has painful spots on her skin.  - currently oral meds:      - metformin  1000 mg po BID    - Januvia 100 mg daily     - Jardiance 25 mg daily     - lantus  5 units once a day   Previous treatments:  - insuiln - pt relates she had a skin reaction rash where the fluid leaked out after the injection into her skin   Complications:   - no known retinopathy - referred to Optho - symptoms of peripheral neuropathy but no skin breakdown on feet (no h/o ulcers), pulses 2+ at  DP bilaterally      - take gabapentin for neuropathy   MASLD/MASH cirrhosis -   - follows in liver clinic with Dr. Fae       - 11/2023 Fibroscan by Dr. Fae without sig findings per his clinic note        - 04/09/24 liver biopsy c/w MASH Stage 3 Fibrosis, possible cholestatic disease    Lab Results  Component Value Date   A1C 11.0 (H) 03/28/2024   A1C 11.3 (H) 03/28/2024   A1C 8.8 (H) 10/02/2023   A1C 13.0 (H) 03/01/2021    Wt Readings from Last 6 Encounters:  05/09/24 51.9 kg (114 lb 6.4 oz)  04/09/24 50.8 kg (112 lb)  03/28/24 51.7 kg (114 lb)  12/04/23 49.9 kg (109 lb 14.4 oz)  11/13/23 49.4 kg (109 lb)  11/07/23 49.5 kg  (109 lb 3.2 oz)     Past Medical History[1]  Current Medications[2]  Allergies[3]  Family History[4]  Short Social History[5]  Review of Systems No chest pain SOB, DOE, swelling, paresthesias, vision changes, or skin/feet problems. Remainder of 12-point review of systems was negative except for pertinent items noted in the HPI.   Objective:     PHYSICAL EXAM:  BP 114/72 (BP  Position: Sitting)   Pulse 90   Ht 149.9 cm (4' 11.02)   Wt 51.9 kg (114 lb 6.4 oz)   BMI 23.09 kg/m  Physical Exam    Gen: no acute distress Body Composition: protuberant abdomen, central adiposity  HEENT:    Ocular: non-icteric, non-injected, EOMI   Oro-pharynx: normal mucosa   NECK: full neck, no bruits  THYROID : normal size gland, NT RESP: CTA bilaterally  CV: RRR, no murmurs appreciated  Abdomen:  Protuberant, Soft, NT/ND, normal BS Extremities: no edema Pscyh:  A&O today  Foot exam:   No skin breakdown, lack of hair, no deformities, pulses 2+ at  DP bilaterally; nailbeds health appearing except on left first toe - nail missing distally and   Monofilament test: deferred  Visual foot inspection - Skin:  normal; Nails:  Left First toe - nail deformed, missing distally; Foot deformities:  none   DATA REVIEW:  04/09/24 LIVER BIOPSY     Component Ref Range & Units (hover)   Diagnosis  A: Liver, biopsy - Steatohepatitis with bridging fibrosis, stage 3 (Brunt system), NAS score 5-6 (steatosis 1-2; lobular inflammation 2; ballooning 2)  - Focal copper accumulation and mild bile ductular reaction, suggesting chronic cholestasis  - See comment   This electronic signature is attestation that the pathologist personally reviewed the submitted material(s) and the final diagnosis reflects that evaluation.  Electronically signed by Stevens Pott, MD on 04/10/2024 at 1643 EDT  Diagnosis Comment   Fragmented liver biopsy demonstrates patchy steatosis (approximately 10-40%) in a mixed macrovesicular  and microvesicular pattern, accompanied by significant hepatocyte ballooning, Mallory hyalins, and mild portal and lobular inflammation. Perivenular/pericellular and periportal fibrosis with bridging is present, confirmed by trichrome stain. Complete nodular formation is not evident in the current biopsy. Reticulin stain highlights preserved liver plate thickness. CK7 immunostain shows mild ductular reaction, and Rhodanine stain reveals focal copper accumulation, which can be secondary to advanced fibrosis. Iron stain shows no increased iron deposition. PAS-diastase stain is negative for alpha-1 antitrypsin-like globules.   The overall findings support a diagnosis of steatohepatitis as the underlying etiology of advanced fibrosis, with a corresponding NAS score of 5-6 (steatosis 1-2; lobular inflammation 2; ballooning 2). Additionally, clinical correlation with serologic and imaging studies is recommended to exclude a coexisting biliary tract disease, given the presence of chronic cholestasis manifested by focal copper accumulation and mild bile ductular reaction.         Lab Review  Lab Results  Component Value Date   NA 137 04/09/2024   K 3.5 04/09/2024   BUN 9 04/09/2024   CREATININE 0.26 (L) 04/09/2024   EGFR >90 04/09/2024   CALCIUM 9.6 04/09/2024   ALBUMIN 4.1 04/09/2024   PHOS 3.9 01/20/2011    Lab Results  Component Value Date   CHOL 214 (H) 10/02/2023   Lab Results  Component Value Date   LDL 102 (H) 10/02/2023   Lab Results  Component Value Date   HDL 46 10/02/2023   Lab Results  Component Value Date   TRIG 328 (H) 10/02/2023   Lab Results  Component Value Date   ALT 146 (H) 04/09/2024    Lab Results  Component Value Date   CREATUR 20.9 03/28/2024   Lab Results  Component Value Date   Albumin Quantitative, Urine <0.3 03/28/2024   Albumin/Creatinine Ratio <14.4 03/28/2024   No results found for: LABCREA, MICROALBQTUR, MSHCGMOM, MALBCRERAT No  results found for: RAFORD ACE, PCRATIOUR  Lab Results  Component Value Date  CPUJFPWA87 7,151 (H) 03/28/2024    No results found for: VITDTOTAL  Lab Results  Component Value Date   TSH 3.442 10/02/2023    Radiology  Other Medical Data Reviewed and summarized records in EPIC/Media and via CareEverywhere x 2 year(s) for purposes of today's visit.   TIME SPENT: 45 minutes        [1] Past Medical History: Diagnosis Date  . Diabetes mellitus (CMS-HCC)   . Liver disease   [2] Current Outpatient Medications  Medication Sig Dispense Refill  . blood sugar diagnostic (GLUCOSE BLOOD) Strp Use to check blood sugar as directed with insulin  3 times a day & for symptoms of high or low blood sugar. 200 strip 11  . blood-glucose meter kit Use as instructed 1 each 1  . capsicum (ZOSTRIX) 0.075 % topical cream Apply 1 Application topically four (4) times a day as needed (pain). (Patient not taking: Reported on 04/09/2024)    . empagliflozin (JARDIANCE) 25 mg tablet Take 1 tablet (25 mg total) by mouth daily. 100 tablet 1  . gabapentin (NEURONTIN) 300 MG capsule Prueba de gabapentina. Si resulta beneficioso, no es necesario aumentar la dosis. De lo contrario, puede aumentarla como se indica a continuaci n: 7 d as: 300 mg por la ma ana y por la noche. Posteriormente: 300 mg por la ma ana, al mediod a y por la noche. 270 capsule 0  . HYDROcodone-acetaminophen  (NORCO) 5-325 mg per tablet Take 1 tablet by mouth every eight (8) hours as needed for pain. 5 tablet 0  . insulin  glargine (LANTUS  SOLOSTAR U-100 INSULIN ) 100 unit/mL (3 mL) injection pen Inject 0.1 mL (10 Units total) under the skin nightly. 15 mL 12  . lancets Misc Use to check blood sugar as directed with insulin  3 times a day & for symptoms of high or low blood sugar. 200 each 11  . metFORMIN  (GLUCOPHAGE ) 1000 MG tablet Take 1 tablet (1,000 mg total) by mouth 2 (two) times a day with meals. 60 tablet 2  . metFORMIN   (GLUCOPHAGE ) 1000 MG tablet Take 1 tablet (1,000 mg total) by mouth in the morning and 1 tablet (1,000 mg total) in the evening. Take with meals. 180 tablet 3  . pen needle, diabetic 32 gauge x 5/32 (4 mm) Ndle Use to inject lantus  insulin  from the pen up once day  in the evening (at the same time every day) 100 each 2  . polyethylene glycol (GLYCOLAX) 17 gram/dose powder Mix 1 capful (17 grams) in 4-8 ounces of water,juice or tea and drink two times daily 1530 g 0  . pravastatin (PRAVACHOL) 20 MG tablet Take 1 tablet (20 mg total) by mouth daily. 100 tablet 3  . propranolol (INDERAL) 10 MG tablet Take 1 tablet (10 mg total) by mouth two (2) times a day. 60 tablet 0  . SITagliptin phosphate (JANUVIA) 100 MG tablet Take 1 tablet (100 mg total) by mouth daily. 100 tablet 3   No current facility-administered medications for this visit.  [3] Allergies Allergen Reactions  . Advil [Ibuprofen] Nausea And Vomiting  [4] Family History Problem Relation Age of Onset  . Diabetes Mother   . Liver disease Father   . Breast cancer Neg Hx   . Colon cancer Neg Hx   . Endometrial cancer Neg Hx   . Ovarian cancer Neg Hx   [5] Social History Tobacco Use  . Smoking status: Never    Passive exposure: Never  . Smokeless tobacco: Never  Vaping Use  .  Vaping status: Never Used  Substance Use Topics  . Alcohol use: Never  . Drug use: Never

## 2024-05-11 ENCOUNTER — Emergency Department
Admission: EM | Admit: 2024-05-11 | Discharge: 2024-05-12 | Disposition: A | Discharge status: Home / Self Care | Payer: Self-pay | Attending: Emergency Medicine | Admitting: Emergency Medicine

## 2024-05-11 ENCOUNTER — Emergency Department: Payer: Self-pay

## 2024-05-11 ENCOUNTER — Other Ambulatory Visit: Payer: Self-pay

## 2024-05-11 DIAGNOSIS — R102 Pelvic and perineal pain unspecified side: Secondary | ICD-10-CM | POA: Diagnosis not present

## 2024-05-11 DIAGNOSIS — E1165 Type 2 diabetes mellitus with hyperglycemia: Secondary | ICD-10-CM | POA: Diagnosis not present

## 2024-05-11 DIAGNOSIS — Y92410 Unspecified street and highway as the place of occurrence of the external cause: Secondary | ICD-10-CM | POA: Insufficient documentation

## 2024-05-11 DIAGNOSIS — M791 Myalgia, unspecified site: Secondary | ICD-10-CM | POA: Insufficient documentation

## 2024-05-11 DIAGNOSIS — M79662 Pain in left lower leg: Secondary | ICD-10-CM | POA: Diagnosis not present

## 2024-05-11 DIAGNOSIS — R0600 Dyspnea, unspecified: Secondary | ICD-10-CM | POA: Diagnosis not present

## 2024-05-11 DIAGNOSIS — R079 Chest pain, unspecified: Secondary | ICD-10-CM | POA: Diagnosis not present

## 2024-05-11 DIAGNOSIS — M79661 Pain in right lower leg: Secondary | ICD-10-CM | POA: Diagnosis not present

## 2024-05-11 DIAGNOSIS — R519 Headache, unspecified: Secondary | ICD-10-CM | POA: Insufficient documentation

## 2024-05-11 DIAGNOSIS — M7918 Myalgia, other site: Secondary | ICD-10-CM

## 2024-05-11 LAB — BASIC METABOLIC PANEL WITH GFR
Anion gap: 13 (ref 5–15)
BUN: 15 mg/dL (ref 6–20)
CO2: 21 mmol/L — ABNORMAL LOW (ref 22–32)
Calcium: 9.3 mg/dL (ref 8.9–10.3)
Chloride: 100 mmol/L (ref 98–111)
Creatinine, Ser: 0.5 mg/dL (ref 0.44–1.00)
GFR, Estimated: >60 mL/min (ref >60)
Glucose, Bld: 444 mg/dL — ABNORMAL HIGH (ref 70–99)
Potassium: 3.6 mmol/L (ref 3.5–5.1)
Sodium: 134 mmol/L — ABNORMAL LOW (ref 135–145)

## 2024-05-11 LAB — CBC WITH DIFFERENTIAL/PLATELET
Abs Immature Granulocytes: 0.03 K/uL (ref 0.00–0.07)
Basophils Absolute: 0.1 K/uL (ref 0.0–0.1)
Basophils Relative: 1 %
Eosinophils Absolute: 0.1 K/uL (ref 0.0–0.5)
Eosinophils Relative: 3 %
HCT: 40.7 % (ref 36.0–46.0)
Hemoglobin: 14.5 g/dL (ref 12.0–15.0)
Immature Granulocytes: 1 %
Lymphocytes Relative: 30 %
Lymphs Abs: 1.2 K/uL (ref 0.7–4.0)
MCH: 31.5 pg (ref 26.0–34.0)
MCHC: 35.6 g/dL (ref 30.0–36.0)
MCV: 88.3 fL (ref 80.0–100.0)
Monocytes Absolute: 0.3 K/uL (ref 0.1–1.0)
Monocytes Relative: 9 %
Neutro Abs: 2.2 K/uL (ref 1.7–7.7)
Neutrophils Relative %: 56 %
Platelets: 80 K/uL — ABNORMAL LOW (ref 150–400)
RBC: 4.61 MIL/uL (ref 3.87–5.11)
RDW: 13.3 % (ref 11.5–15.5)
WBC: 3.9 K/uL — ABNORMAL LOW (ref 4.0–10.5)
nRBC: 0 % (ref 0.0–0.2)

## 2024-05-11 LAB — CBG MONITORING, ED
Glucose-Capillary: 409 mg/dL — ABNORMAL HIGH (ref 70–99)
Glucose-Capillary: 350 mg/dL — ABNORMAL HIGH (ref 70–99)

## 2024-05-11 LAB — TROPONIN I (HIGH SENSITIVITY): Troponin I (High Sensitivity): 5 ng/L (ref <18)

## 2024-05-11 MED ORDER — MORPHINE SULFATE (PF) 4 MG/ML IV SOLN
4.0000 mg | Freq: Once | INTRAVENOUS | Status: AC
  Administered 2024-05-11: 4 mg via INTRAVENOUS
  Filled 2024-05-11: qty 1

## 2024-05-11 MED ORDER — IOHEXOL 350 MG/ML SOLN
75.0000 mL | Freq: Once | INTRAVENOUS | Status: AC | PRN
  Administered 2024-05-11: 75 mL via INTRAVENOUS

## 2024-05-11 MED ORDER — SODIUM CHLORIDE 0.9 % IV BOLUS
1000.0000 mL | Freq: Once | INTRAVENOUS | Status: AC
  Administered 2024-05-11: 1000 mL via INTRAVENOUS

## 2024-05-11 NOTE — ED Notes (Addendum)
 PT called out stated that she needed to use the bathroom. Pt told RN that she used cane to walk at home. While walking to the bathroom. Pt stated that she was feeling lightheaded and assisted to the floor. PA at bedside to assess pt NT obtained vital signs, VSS. Pt then assisted back to bed and placed on bedpan.

## 2024-05-11 NOTE — ED Provider Notes (Signed)
 EKG interpreted by me at 9 PM heart rate 90 QRS 120 QTc approximately 500.  Bifascicular block normal sinus rhythm no evidence of acute ischemia.  Compared with previous EKG from July 16, 2021 no changes noted.   [name], MD 05/11/24 2100
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
## 2024-05-11 NOTE — ED Notes (Signed)
 Assisted patient with bedpad and peri care provided.
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
## 2024-05-12 MED ORDER — MORPHINE SULFATE (PF) 4 MG/ML IV SOLN
4.0000 mg | Freq: Once | INTRAVENOUS | Status: DC
Start: 2024-05-12 — End: 2024-05-12

## 2024-05-12 MED ORDER — KETOROLAC TROMETHAMINE 15 MG/ML IJ SOLN
15.0000 mg | Freq: Once | INTRAMUSCULAR | Status: AC
  Administered 2024-05-12: 15 mg via INTRAVENOUS
  Filled 2024-05-12: qty 1

## 2024-05-12 MED ORDER — CYCLOBENZAPRINE HCL 10 MG PO TABS
5.0000 mg | ORAL_TABLET | Freq: Once | ORAL | Status: AC
  Administered 2024-05-12: 5 mg via ORAL
  Filled 2024-05-12: qty 1

## 2024-05-12 NOTE — Discharge Instructions (Addendum)
 Hoy acudi al servicio de urgencias para una evaluacin tras un accidente de coche. Todas las radiografas y la tomografa computarizada resultaron normales. No presenta fracturas ni otras anomalas graves. Los anlisis de sangre mostraron que su nivel de international aid/development worker en sangre era bastante alto. Este baj despus de administrarle lquidos intravenosos. Por favor, asegrese de tomar su medicacin para la diabetes segn lo prescrito. Si su nivel de azcar en sangre permanece alto durante un periodo prolongado, esto puede tener consecuencias negativas para su salud a largo plazo.  Es probable que maana sienta ms dolor que hoy; esto es normal. Despus de esto, el dolor debera ir disminuyendo gradualmente. De no ser as, scientist, clinical (histocompatibility and immunogenetics) con otro profesional sanitario. Puede ser su mdico de cabecera, acudir a un centro de atencin urgente o al servicio de [place]. Vuelva al servicio de urgencias si presenta dolor de pecho, dificultad para respirar, dolor abdominal, hematomas abdominales nuevos o cualquier otro sntoma que le preocupe.  Puede tomar 650 mg de paracetamol y 600 mg de ibuprofeno cada 6 horas segn sea necesario para el dolor. Tambin puedes usar hielo, calor, cremas musculares y otros analgsicos tpicos.
